# Patient Record
Sex: Female | Born: 1965 | Race: White | Hispanic: No | Marital: Married | Smoking: Current every day smoker
Health system: Southern US, Community
[De-identification: ages and names within clinical notes are randomized; demographics above are authoritative.]

## PROBLEM LIST (undated history)

## (undated) DIAGNOSIS — M722 Plantar fascial fibromatosis: Secondary | ICD-10-CM

## (undated) DIAGNOSIS — F419 Anxiety disorder, unspecified: Secondary | ICD-10-CM

## (undated) DIAGNOSIS — I10 Essential (primary) hypertension: Secondary | ICD-10-CM

## (undated) DIAGNOSIS — M797 Fibromyalgia: Secondary | ICD-10-CM

## (undated) DIAGNOSIS — E7800 Pure hypercholesterolemia, unspecified: Secondary | ICD-10-CM

## (undated) DIAGNOSIS — M1990 Unspecified osteoarthritis, unspecified site: Secondary | ICD-10-CM

## (undated) DIAGNOSIS — Z87442 Personal history of urinary calculi: Secondary | ICD-10-CM

## (undated) DIAGNOSIS — K589 Irritable bowel syndrome without diarrhea: Secondary | ICD-10-CM

## (undated) DIAGNOSIS — E119 Type 2 diabetes mellitus without complications: Secondary | ICD-10-CM

## (undated) DIAGNOSIS — G43909 Migraine, unspecified, not intractable, without status migrainosus: Secondary | ICD-10-CM

## (undated) HISTORY — DX: Plantar fascial fibromatosis: M72.2

## (undated) HISTORY — DX: Pure hypercholesterolemia, unspecified: E78.00

## (undated) HISTORY — DX: Unspecified osteoarthritis, unspecified site: M19.90

## (undated) HISTORY — PX: VAGINAL HYSTERECTOMY: SUR661

## (undated) HISTORY — DX: Irritable bowel syndrome, unspecified: K58.9

## (undated) HISTORY — DX: Fibromyalgia: M79.7

## (undated) HISTORY — PX: WRIST SURGERY: SHX841

## (undated) HISTORY — PX: ENDOSCOPIC PLANTAR FASCIOTOMY: SUR443

## (undated) HISTORY — DX: Migraine, unspecified, not intractable, without status migrainosus: G43.909

## (undated) HISTORY — DX: Essential (primary) hypertension: I10

---

## 2007-12-17 HISTORY — PX: COLONOSCOPY: SHX174

## 2011-09-03 DIAGNOSIS — E119 Type 2 diabetes mellitus without complications: Secondary | ICD-10-CM

## 2011-09-03 HISTORY — DX: Type 2 diabetes mellitus without complications: E11.9

## 2015-04-19 ENCOUNTER — Institutional Professional Consult (permissible substitution): Payer: Self-pay | Admitting: Internal Medicine

## 2015-04-27 ENCOUNTER — Ambulatory Visit (INDEPENDENT_AMBULATORY_CARE_PROVIDER_SITE_OTHER)
Admission: RE | Admit: 2015-04-27 | Discharge: 2015-04-27 | Disposition: A | Discharge status: Home / Self Care | Payer: 59 | Source: Ambulatory Visit | Attending: Pulmonary Disease | Admitting: Pulmonary Disease

## 2015-04-27 ENCOUNTER — Encounter: Payer: Self-pay | Admitting: Pulmonary Disease

## 2015-04-27 ENCOUNTER — Ambulatory Visit (INDEPENDENT_AMBULATORY_CARE_PROVIDER_SITE_OTHER): Payer: 59 | Admitting: Pulmonary Disease

## 2015-04-27 VITALS — BP 136/84 | HR 79 | Ht 60.75 in | Wt 159.0 lb

## 2015-04-27 DIAGNOSIS — R0600 Dyspnea, unspecified: Secondary | ICD-10-CM

## 2015-04-27 DIAGNOSIS — R5383 Other fatigue: Secondary | ICD-10-CM | POA: Diagnosis not present

## 2015-04-27 NOTE — Assessment & Plan Note (Addendum)
She is complaining of dyspnea which has been persistent for the last year. It is causing moderate to severe symptoms with exertion. Today on physical exam her lungs were normal and her resting oxygenation was normal. Objectively, her most recent chest x-ray was in 2014 and did not show lung disease, her simple spirometry test from her primary care physician's office was normal as well.  So in summary I currently have no evidence of lung disease but the workup is not complete.  I explained to her that the differential diagnosis of dyspnea is broad and includes lung disease, heart disease, anemia, and neurologic disease among other causes. I'm encouraged by the fact that her cardiovascular exam is normal, and she does not have chest pain. It does not sound as if she has been bleeding but I'm not certain that she's had a hemoglobin level checked recently. Today her neurologic exam was within normal limits.  Plan: We will take a stepwise approach to assessing her dyspnea Full pulmonary function testing, depending on results she may or may not need a CT scan of the chest Obtain records of most recent hemoglobin from primary care physician's office If pulmonary function testing and CT chest are normal, then we will set up a cardiac evaluation Ambulatory oximetry today Follow-up 2 weeks or sooner if needed

## 2015-04-27 NOTE — Progress Notes (Signed)
Subjective:    Patient ID: Valerie Horn, female    DOB: July 05, 1966, 49 y.o.   MRN: [id number]  HPI Chief Complaint  Patient presents with  . Advice Only    referred by Dr. Sedalia Muta for Sob.  pt feels like she isn't able to easily take in a deep breath.      This is a pleasant lady who is 49 years old and formally smoked one pack of cigarettes daily for 20 years he comes to my clinic today for evaluation of shortness of breath. She tells me that the problem has been persistent for the last year. She says she has the sensation that she can't get enough air into her lungs and feels that she has to take a deep breath 70-80 times a day. She says that she has noticed that she will get short of breath with exertion such as climbing a flight of stairs at work. She denies associated chest pain or wheezing. She has not had leg swelling recently. Apparently she previously had some ankle swelling which improved with changes in her blood pressure medication. She says that she has never been told she has a cardiac disease. She had a normal childhood with only some mild ear infections but no respiratory illnesses. Apparently as an early adult (her 63s) she had recurrent episodes of bronchitis but never required hospitalization.  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 She denies any neuromuscular weakness lately.  Her husband notes that she snores, and he has witnessed her stop breathing in the middle the night. She says that she has a lot of fatigue during the daytime, feels the need to take a nap in the afternoon.   Past Medical History  Diagnosis Date  . Migraine   . Fibromyalgia   . Hypercholesteremia   . Hypertension   . Arthritis   . Spastic colon   . IBS (irritable bowel syndrome)      Family History  Problem  Relation Age of Onset  . COPD Mother   . Congestive Heart Failure Mother   . Cancer Maternal Grandmother      Social History   Social History  . Marital Status: Married    Spouse Name: N/A  . Number of Children: N/A  . Years of Education: N/A   Occupational History  . Not on file.   Social History Main Topics  . Smoking status: Former Smoker -- 1.00 packs/day for 24 years    Types: Cigarettes    Quit date: 04/26/2010  . Smokeless tobacco: Never Used  . Alcohol Use: Not on file  . Drug Use: Not on file  . Sexual Activity: Not on file   Other Topics Concern  . Not on file   Social History Narrative  . No narrative on file     Allergies  Allergen Reactions  . Celebrex [Celecoxib] Hives  . Morphine And Related      No outpatient prescriptions prior to visit.   No facility-administered medications prior to visit.       Review of Systems  Constitutional: Positive for fatigue. Negative for fever and unexpected weight change.  HENT: Negative for congestion, dental problem, ear pain, nosebleeds, postnasal drip, rhinorrhea, sinus pressure, sneezing, sore throat and trouble swallowing.   Eyes: Negative for redness and itching.  Respiratory: Positive for shortness of breath. Negative for cough, chest tightness and wheezing.   Cardiovascular: Negative for palpitations and leg swelling.  Gastrointestinal: Negative for nausea and vomiting.  Genitourinary: Negative for dysuria.  Musculoskeletal: Negative for joint swelling.  Skin: Negative for rash.  Neurological: Negative for headaches.  Hematological: Does not bruise/bleed easily.  Psychiatric/Behavioral: Negative for dysphoric mood. The patient is not nervous/anxious.        Objective:   Physical Exam Filed Vitals:   04/27/15 1523  BP: 136/84  Pulse: 79  Height: 5' 0.75" (1.543 m)  Weight: 159 lb (72.122 kg)  SpO2: 97%   RA  Ambulated 500 feet in the office and her O2 saturation was no lower than  98%  Gen: well appearing, no acute distress HENT: NCAT, OP clear, neck supple without masses Eyes: PERRL, EOMi Lymph: no cervical lymphadenopathy PULM: CTA B CV: RRR, no mgr, no JVD GI: BS+, soft, nontender, no hsm Derm: no rash or skin breakdown MSK: normal bulk and tone Neuro: A&Ox4, CN II-XII intact, strength 5/5 in all 4 extremities Psyche: normal mood and affect  2014 chest x-ray images personally reviewed showing normal lung fields and a normal cardiac silhouette Most recent primary care physician's office notes were reviewed where she was treated for fibromyalgia and hypertension and referred to [place] for dyspnea.      Assessment & Plan:  Dyspnea She is complaining of dyspnea which has been persistent for the last year. It is causing moderate to severe symptoms with exertion. Today on physical exam her lungs were normal and her resting oxygenation was normal. Objectively, her most recent chest x-ray was in 2014 and did not show lung disease, her simple spirometry test from her primary care physician's office was normal as well.  So in summary I currently have no evidence of lung disease but the workup is not complete.  I explained to her that the differential diagnosis of dyspnea is broad and includes lung disease, heart disease, anemia, and neurologic disease among other causes. I'm encouraged by the fact that her cardiovascular exam is normal, and she does not have chest pain. It does not sound as if she has been bleeding but I'm not certain that she's had a hemoglobin level checked recently. Today her neurologic exam was within normal limits.  Plan: We will take a stepwise approach to assessing her dyspnea Full pulmonary function testing, depending on results she may or may not need a CT scan of the chest Obtain records of most recent hemoglobin from primary care physician's office If pulmonary function testing and CT chest are normal, then we will set up a cardiac  evaluation Ambulatory oximetry today Follow-up 2 weeks or sooner if needed  Fatigue She has daytime somnolence and fatigue with a history of snoring and witnessed apneas. This likely represents obstructive sleep apnea. She would prefer to have her lung function testing first before assessing her for obstructive sleep apnea.  Plan: No driving while sleepy If lung workup is negative then we'll pursue a polysomnogram.     Current outpatient prescriptions:  .  atenolol (TENORMIN) 50 MG tablet, Take 50 mg by mouth daily., Disp: , Rfl:  .  atorvastatin (LIPITOR) 20 MG tablet, Take 20 mg by mouth daily., Disp: , Rfl:  .  busPIRone (BUSPAR) 15 MG tablet, Take 15 mg by mouth 2 (two) times daily., Disp: , Rfl:  .  dicyclomine (BENTYL) 20 MG tablet, Take 20 mg by mouth 3 (three) times daily before  meals., Disp: , Rfl:  .  DULoxetine (CYMBALTA) 30 MG capsule, Take 120 mg by mouth daily., Disp: , Rfl:  .  gabapentin (NEURONTIN) 300 MG capsule, Take 600 mg by mouth 3 (three) times daily., Disp: , Rfl:  .  meloxicam (MOBIC) 15 MG tablet, Take 15 mg by mouth daily., Disp: , Rfl:  .  SUMAtriptan (IMITREX) 100 MG tablet, Take 100 mg by mouth once. May repeat in 2 hours if headache persists or recurs., Disp: , Rfl:  .  traMADol (ULTRAM) 50 MG tablet, Take 50 mg by mouth 2 (two) times daily., Disp: , Rfl:

## 2015-04-27 NOTE — Patient Instructions (Signed)
We will order a lung function test, and a chest x-ray and call you with the results We will see you back in 2 weeks to go over the results

## 2015-04-27 NOTE — Assessment & Plan Note (Signed)
She has daytime somnolence and fatigue with a history of snoring and witnessed apneas. This likely represents obstructive sleep apnea. She would prefer to have her lung function testing first before assessing her for obstructive sleep apnea.  Plan: No driving while sleepy If lung workup is negative then we'll pursue a polysomnogram.

## 2015-04-28 ENCOUNTER — Telehealth: Payer: Self-pay | Admitting: Pulmonary Disease

## 2015-04-28 ENCOUNTER — Other Ambulatory Visit (HOSPITAL_COMMUNITY): Payer: Self-pay | Admitting: Respiratory Therapy

## 2015-04-28 NOTE — Telephone Encounter (Signed)
Per Cxr results:  Result Note     A,    Please let the patient know this was OK    Thanks,    B   ---  [id number] [id number]

## 2015-05-01 NOTE — Telephone Encounter (Signed)
Patient notified.  No questions or concerns at this time. Nothing further needed.   

## 2015-05-05 ENCOUNTER — Ambulatory Visit (HOSPITAL_COMMUNITY)
Admission: RE | Admit: 2015-05-05 | Discharge: 2015-05-05 | Disposition: A | Discharge status: Home / Self Care | Payer: 59 | Source: Ambulatory Visit | Attending: Pulmonary Disease | Admitting: Pulmonary Disease

## 2015-05-05 DIAGNOSIS — R0600 Dyspnea, unspecified: Secondary | ICD-10-CM | POA: Insufficient documentation

## 2015-05-05 LAB — PULMONARY FUNCTION TEST
DL/VA % pred: 102 %
DL/VA: 4.45 ml/min/mmHg/L
DLCO UNC % PRED: 83 %
DLCO UNC: 16.66 ml/min/mmHg
FEF 25-75 PRE: 2.4 L/s
FEF 25-75 Post: 3.66 L/sec
FEF2575-%CHANGE-POST: 52 %
FEF2575-%Pred-Post: 138 %
FEF2575-%Pred-Pre: 90 %
FEV1-%Change-Post: 8 %
FEV1-%PRED-POST: 91 %
FEV1-%Pred-Pre: 84 %
FEV1-POST: 2.34 L
FEV1-Pre: 2.15 L
FEV1FVC-%Change-Post: 6 %
FEV1FVC-%Pred-Pre: 103 %
FEV6-%CHANGE-POST: 2 %
FEV6-%PRED-POST: 84 %
FEV6-%PRED-PRE: 82 %
FEV6-PRE: 2.56 L
FEV6-Post: 2.61 L
FEV6FVC-%CHANGE-POST: 0 %
FEV6FVC-%PRED-PRE: 103 %
FEV6FVC-%Pred-Post: 103 %
FVC-%CHANGE-POST: 2 %
FVC-%Pred-Post: 81 %
FVC-%Pred-Pre: 80 %
FVC-Post: 2.61 L
FVC-Pre: 2.56 L
POST FEV6/FVC RATIO: 100 %
Post FEV1/FVC ratio: 89 %
Pre FEV1/FVC ratio: 84 %
Pre FEV6/FVC Ratio: 100 %
RV % PRED: 101 %
RV: 1.62 L
TLC % pred: 91 %
TLC: 4.18 L

## 2015-05-05 MED ORDER — ALBUTEROL SULFATE (2.5 MG/3ML) 0.083% IN NEBU
2.5000 mg | INHALATION_SOLUTION | Freq: Once | RESPIRATORY_TRACT | Status: AC
  Administered 2015-05-05: 2.5 mg via RESPIRATORY_TRACT

## 2015-05-10 ENCOUNTER — Ambulatory Visit (INDEPENDENT_AMBULATORY_CARE_PROVIDER_SITE_OTHER): Payer: 59 | Admitting: Pulmonary Disease

## 2015-05-10 VITALS — BP 128/64 | HR 70 | Ht 60.75 in | Wt 158.0 lb

## 2015-05-10 DIAGNOSIS — R5383 Other fatigue: Secondary | ICD-10-CM | POA: Diagnosis not present

## 2015-05-10 DIAGNOSIS — R0600 Dyspnea, unspecified: Secondary | ICD-10-CM | POA: Diagnosis not present

## 2015-05-10 NOTE — Progress Notes (Signed)
Subjective:    Patient ID: Valerie Horn, female    DOB: [birth year], 49 y.o.   MRN: [id number]  Synopsis: First seen by [facility] in 2016 for dyspnea. Smoked heavily 3 2016. 03/2015 H/H > 12.5 05/2015 Ratio 89% FEV 2.34L (91%) TLC 4.18 L (91%) DLCO 16.66 (83%)  HPI Chief Complaint  Patient presents with  . Follow-up    review PFT and cxr.  pt c/o stable DOE, wheezing.   Kenetra says that she continues to have some shortness of breath when she exerts her self. Minimal cough. She has noticed an occasional wheeze which she says is associated with a rattling in the left lower chest. She still has fatigue throughout the course the day. Her husband notes ongoing snoring and witnessed apneas.  Past Medical History  Diagnosis Date  . Migraine   . Fibromyalgia   . Hypercholesteremia   . Hypertension   . Arthritis   . Spastic colon   . IBS (irritable bowel syndrome)       Review of Systems  Constitutional: Positive for fatigue. Negative for fever and chills.  HENT: Negative for postnasal drip, rhinorrhea and sinus pressure.   Respiratory: Positive for cough, shortness of breath and wheezing.   Cardiovascular: Negative for chest pain, palpitations and leg swelling.       Objective:   Physical Exam Filed Vitals:   05/10/15 1536  BP: 128/64  Pulse: 70  Height: 5' 0.75" (1.543 m)  Weight: 158 lb (71.668 kg)  SpO2: 98%   RA  Gen: well appearing HENT: OP clear, TM's clear, neck supple PULM: CTA B, normal percussion CV: RRR, no mgr, trace edema GI: BS+, soft, nontender Derm: no cyanosis or rash Psyche: normal mood and affect  PFT and CXR both normal, CXR images personally reviewed     Assessment & Plan:  Dyspnea She continues to have dyspnea. Objectively she is not anemic, her lung function testing was completely normal, her chest x-ray is normal, her lung exam is normal, and her ambulatory oximetry is normal. So I cannot find evidence of lung disease at this  time. Because of her lengthy smoking history she is at increased risk for cardiac disease.  Plan: Exercise stress test with nuclear imaging  Fatigue This continues to be a problem. She is overweight, has witnessed apneas and heavy snoring and feels fatigue throughout the course the day. She is at high-risk for having obstructive sleep apnea.  Plan: Polysomnogram     Current outpatient prescriptions:  .  atenolol (TENORMIN) 50 MG tablet, Take 50 mg by mouth daily., Disp: , Rfl:  .  atorvastatin (LIPITOR) 20 MG tablet, Take 20 mg by mouth daily., Disp: , Rfl:  .  busPIRone (BUSPAR) 15 MG tablet, Take 15 mg by mouth 2 (two) times daily., Disp: , Rfl:  .  dicyclomine (BENTYL) 20 MG tablet, Take 20 mg by mouth 3 (three) times daily before meals., Disp: , Rfl:  .  DULoxetine (CYMBALTA) 30 MG capsule, Take 120 mg by mouth daily., Disp: , Rfl:  .  gabapentin (NEURONTIN) 300 MG capsule, Take 600 mg by mouth 3 (three) times daily., Disp: , Rfl:  .  meloxicam (MOBIC) 15 MG tablet, Take 15 mg by mouth daily., Disp: , Rfl:  .  SUMAtriptan (IMITREX) 100 MG tablet, Take 100 mg by mouth once. May repeat in 2 hours if headache persists or recurs., Disp: , Rfl:  .  traMADol (ULTRAM) 50 MG tablet, Take 50 mg by mouth 2 (  two) times daily., Disp: , Rfl:

## 2015-05-10 NOTE — Assessment & Plan Note (Signed)
This continues to be a problem. She is overweight, has witnessed apneas and heavy snoring and feels fatigue throughout the course the day. She is at high-risk for having obstructive sleep apnea.  Plan: Polysomnogram

## 2015-05-10 NOTE — Assessment & Plan Note (Signed)
She continues to have dyspnea. Objectively she is not anemic, her lung function testing was completely normal, her chest x-ray is normal, her lung exam is normal, and her ambulatory oximetry is normal. So I cannot find evidence of lung disease at this time. Because of her lengthy smoking history she is at increased risk for cardiac disease.  Plan: Exercise stress test with nuclear imaging

## 2015-05-10 NOTE — Patient Instructions (Signed)
We will arrange an exercise stress testing, you with results We will arrange a sleep study and call you with the results We will see you back in 4 weeks or sooner if needed

## 2015-05-11 ENCOUNTER — Encounter: Payer: Self-pay | Admitting: Pulmonary Disease

## 2015-05-17 ENCOUNTER — Telehealth (HOSPITAL_COMMUNITY): Payer: Self-pay | Admitting: *Deleted

## 2015-05-17 NOTE — Telephone Encounter (Signed)
Patient given detailed instructions per Myocardial Perfusion Study Information Sheet for test on 05/19/15 at 915. Patient notified to arrive 15 minutes early and that it is imperative to arrive on time for appointment to keep from having the test rescheduled.  If you need to cancel or reschedule your appointment, please call the office within 24 hours of your appointment. Failure to do so may result in a cancellation of your appointment, and a $50 no show fee. Patient verbalized understanding. Antionette Char, RN

## 2015-05-18 ENCOUNTER — Telehealth: Payer: Self-pay

## 2015-05-19 ENCOUNTER — Encounter (HOSPITAL_COMMUNITY): Payer: 59

## 2015-05-24 ENCOUNTER — Telehealth: Payer: Self-pay

## 2015-05-24 NOTE — Telephone Encounter (Signed)
RECEIVED PHONE CALL FROM PATIENT SHE IS WANTING TO RESC NUC TEST THAT DR .Kendrick Fries OFFICE [facility].  I  WILL FORWARD MESSAGE TO OUR [facility]

## 2015-05-29 ENCOUNTER — Telehealth (HOSPITAL_COMMUNITY): Payer: Self-pay | Admitting: *Deleted

## 2015-05-29 NOTE — Telephone Encounter (Signed)
Patient given detailed instructions per Myocardial Perfusion Study Information Sheet for test on 05/30/15 at 7:00. Patient notified to arrive 15 minutes early and that it is imperative to arrive on time for appointment to keep from having the test rescheduled.  If you need to cancel or reschedule your appointment, please call the office within 24 hours of your appointment. Failure to do so may result in a cancellation of your appointment, and a $50 no show fee. Patient verbalized understanding. Valerie Horn

## 2015-05-30 ENCOUNTER — Ambulatory Visit (HOSPITAL_COMMUNITY): Payer: 59 | Attending: Pulmonary Disease

## 2015-05-30 DIAGNOSIS — E785 Hyperlipidemia, unspecified: Secondary | ICD-10-CM | POA: Diagnosis not present

## 2015-05-30 DIAGNOSIS — R0602 Shortness of breath: Secondary | ICD-10-CM | POA: Diagnosis not present

## 2015-05-30 DIAGNOSIS — R0600 Dyspnea, unspecified: Secondary | ICD-10-CM | POA: Diagnosis not present

## 2015-05-30 DIAGNOSIS — E119 Type 2 diabetes mellitus without complications: Secondary | ICD-10-CM | POA: Diagnosis not present

## 2015-05-30 DIAGNOSIS — R002 Palpitations: Secondary | ICD-10-CM | POA: Insufficient documentation

## 2015-05-30 DIAGNOSIS — R5383 Other fatigue: Secondary | ICD-10-CM | POA: Insufficient documentation

## 2015-05-30 DIAGNOSIS — I10 Essential (primary) hypertension: Secondary | ICD-10-CM | POA: Insufficient documentation

## 2015-05-30 LAB — MYOCARDIAL PERFUSION IMAGING
CHL CUP NUCLEAR SDS: 1
CHL CUP NUCLEAR SRS: 1
CHL CUP RESTING HR STRESS: 86 {beats}/min
CSEPED: 4 min
CSEPEDS: 30 s
Estimated workload: 6.4 METS
LV dias vol: 80 mL
LV sys vol: 30 mL
MPHR: 172 {beats}/min
Peak HR: 151 {beats}/min
Percent HR: 87 %
RATE: 0.4
RPE: 18
SSS: 2
TID: 1.04

## 2015-05-30 MED ORDER — TECHNETIUM TC 99M SESTAMIBI GENERIC - CARDIOLITE
10.6000 | Freq: Once | INTRAVENOUS | Status: AC | PRN
  Administered 2015-05-30: 11 via INTRAVENOUS

## 2015-05-30 MED ORDER — TECHNETIUM TC 99M SESTAMIBI GENERIC - CARDIOLITE
31.8000 | Freq: Once | INTRAVENOUS | Status: AC | PRN
  Administered 2015-05-30: 31.8 via INTRAVENOUS

## 2015-06-21 ENCOUNTER — Ambulatory Visit (HOSPITAL_BASED_OUTPATIENT_CLINIC_OR_DEPARTMENT_OTHER): Payer: 59 | Attending: Pulmonary Disease | Admitting: Radiology

## 2015-06-21 DIAGNOSIS — R0683 Snoring: Secondary | ICD-10-CM | POA: Insufficient documentation

## 2015-06-21 DIAGNOSIS — G4730 Sleep apnea, unspecified: Secondary | ICD-10-CM | POA: Insufficient documentation

## 2015-06-21 DIAGNOSIS — R51 Headache: Secondary | ICD-10-CM | POA: Insufficient documentation

## 2015-06-21 DIAGNOSIS — R5383 Other fatigue: Secondary | ICD-10-CM | POA: Diagnosis not present

## 2015-06-27 DIAGNOSIS — R5383 Other fatigue: Secondary | ICD-10-CM | POA: Diagnosis not present

## 2015-06-27 NOTE — Progress Notes (Signed)
Patient Name: Valerie Horn, Valerie Horn Date: 06/21/2015 Gender: Female D.O.B: [birth year] Age (years): 49 Referring Provider: Max Fickleouglas McQuaid Height (inches): 60 Interpreting Physician: Cyril Mourningakesh Alva MD, ABSM Weight (lbs): 158 RPSGT: Valerie Horn, [place] BMI: 31 MRN: [id number] Neck Size: 14.00   CLINICAL INFORMATION Sleep Horn Type: NPSG Indication for sleep Horn: Daytime Fatigue, Fatigue, Morning Headaches, Nocturnal Gasping, Restless Sleep with Limb Movments, Snoring, Witnesses Apnea / Gasping During Sleep Epworth Sleepiness Score: 11   SLEEP Horn TECHNIQUE As per the AASM Manual for the Scoring of Sleep and Associated Events v2.3 (April 2016) with a hypopnea requiring 4% desaturations. The channels recorded and monitored were frontal, central and occipital EEG, electrooculogram (EOG), submentalis EMG (chin), nasal and oral airflow, thoracic and abdominal wall motion, anterior tibialis EMG, snore microphone, electrocardiogram, and pulse oximetry.   MEDICATIONS Patient's medications include: PATIENT REPORTED BUSPAR, BENTYL AND GABAPENTIN WERE TAKEN AT 1950. Medications self-administered by patient during sleep Horn : No sleep medicine administered.   SLEEP ARCHITECTURE The Horn was initiated at 10:04:56 PM and ended at 4:38:21 AM. Sleep onset time was 7.8 minutes and the sleep efficiency was 83.5%. The total sleep time was 328.5 minutes. Stage REM latency was 233.0 minutes. The patient spent 13.09% of the night in stage N1 sleep, 73.36% in stage N2 sleep, 0.00% in stage N3 and 13.55% in REM. Alpha intrusion was absent. Supine sleep was 15.53%.   RESPIRATORY PARAMETERS The overall apnea/hypopnea index (AHI) was 2.9 per hour. There were 3 total apneas, including 2 obstructive, 0 central and 1 mixed apneas. There were 13 hypopneas and 8 RERAs. The AHI during Stage REM sleep was 14.8 per hour. AHI while supine was 1.2 per hour. The mean oxygen saturation was 92.69%. The minimum  SpO2 during sleep was 86.00%. Soft snoring was noted during this Horn.  CARDIAC DATA The 2 lead EKG demonstrated sinus rhythm. The mean heart rate was 73.69 beats per minute. Other EKG findings include: None.  LEG MOVEMENT DATA The total PLMS were 8 with a resulting PLMS index of 1.46. Associated arousal with leg movement index was 0.7 .   IMPRESSIONS - No significant obstructive sleep apnea occurred during this Horn (AHI = 2.9/h). - No significant central sleep apnea occurred during this Horn (CAI = 0.0/h). - Mild REM related oxygen desaturation was noted during this Horn (Min O2 = 86.00%). - The patient snored with Soft snoring volume. - No cardiac abnormalities were noted during this Horn. - Clinically significant periodic limb movements did not occur during sleep. No significant associated arousals.   DIAGNOSIS - No evidence of sleep disordered breathing - Mild REM-related Desaturation does not require treatment   RECOMMENDATIONS - Avoid alcohol, sedatives and other CNS depressants that may worsen sleep apnea and disrupt normal sleep architecture. - Sleep hygiene should be reviewed to assess factors that may improve sleep quality. - Weight management and regular exercise should be initiated or continued if appropriate.  Cyril Mourningakesh Alva MD. Tonny BollmanFCCP. [facility]

## 2015-07-06 ENCOUNTER — Ambulatory Visit: Payer: 59 | Admitting: Pulmonary Disease

## 2016-03-14 DIAGNOSIS — F320 Major depressive disorder, single episode, mild: Secondary | ICD-10-CM | POA: Diagnosis not present

## 2016-03-14 DIAGNOSIS — I10 Essential (primary) hypertension: Secondary | ICD-10-CM | POA: Diagnosis not present

## 2016-03-14 DIAGNOSIS — R7301 Impaired fasting glucose: Secondary | ICD-10-CM | POA: Diagnosis not present

## 2016-03-14 DIAGNOSIS — M150 Primary generalized (osteo)arthritis: Secondary | ICD-10-CM | POA: Diagnosis not present

## 2016-03-14 DIAGNOSIS — E782 Mixed hyperlipidemia: Secondary | ICD-10-CM | POA: Diagnosis not present

## 2016-03-14 DIAGNOSIS — G43109 Migraine with aura, not intractable, without status migrainosus: Secondary | ICD-10-CM | POA: Diagnosis not present

## 2016-03-14 DIAGNOSIS — R799 Abnormal finding of blood chemistry, unspecified: Secondary | ICD-10-CM | POA: Diagnosis not present

## 2016-03-14 DIAGNOSIS — R5383 Other fatigue: Secondary | ICD-10-CM | POA: Diagnosis not present

## 2016-03-14 DIAGNOSIS — E784 Other hyperlipidemia: Secondary | ICD-10-CM | POA: Diagnosis not present

## 2016-05-18 DIAGNOSIS — N201 Calculus of ureter: Secondary | ICD-10-CM | POA: Diagnosis not present

## 2016-05-18 DIAGNOSIS — N200 Calculus of kidney: Secondary | ICD-10-CM | POA: Diagnosis not present

## 2016-06-19 DIAGNOSIS — M150 Primary generalized (osteo)arthritis: Secondary | ICD-10-CM | POA: Diagnosis not present

## 2016-06-19 DIAGNOSIS — E782 Mixed hyperlipidemia: Secondary | ICD-10-CM | POA: Diagnosis not present

## 2016-06-19 DIAGNOSIS — F320 Major depressive disorder, single episode, mild: Secondary | ICD-10-CM | POA: Diagnosis not present

## 2016-06-19 DIAGNOSIS — G43109 Migraine with aura, not intractable, without status migrainosus: Secondary | ICD-10-CM | POA: Diagnosis not present

## 2016-06-19 DIAGNOSIS — E784 Other hyperlipidemia: Secondary | ICD-10-CM | POA: Diagnosis not present

## 2016-06-19 DIAGNOSIS — E119 Type 2 diabetes mellitus without complications: Secondary | ICD-10-CM | POA: Diagnosis not present

## 2016-09-02 HISTORY — PX: IR RETRO URETHROCYSTOGRAM: IMG2375

## 2016-10-29 DIAGNOSIS — M25562 Pain in left knee: Secondary | ICD-10-CM | POA: Diagnosis not present

## 2016-10-29 DIAGNOSIS — M25569 Pain in unspecified knee: Secondary | ICD-10-CM | POA: Diagnosis not present

## 2016-10-29 DIAGNOSIS — K580 Irritable bowel syndrome with diarrhea: Secondary | ICD-10-CM | POA: Diagnosis not present

## 2016-12-19 DIAGNOSIS — B029 Zoster without complications: Secondary | ICD-10-CM | POA: Diagnosis not present

## 2017-01-16 DIAGNOSIS — F320 Major depressive disorder, single episode, mild: Secondary | ICD-10-CM | POA: Diagnosis not present

## 2017-01-16 DIAGNOSIS — M797 Fibromyalgia: Secondary | ICD-10-CM | POA: Diagnosis not present

## 2017-01-29 DIAGNOSIS — R740 Nonspecific elevation of levels of transaminase and lactic acid dehydrogenase [LDH]: Secondary | ICD-10-CM | POA: Diagnosis not present

## 2017-01-29 DIAGNOSIS — Z0000 Encounter for general adult medical examination without abnormal findings: Secondary | ICD-10-CM | POA: Diagnosis not present

## 2017-01-29 DIAGNOSIS — Z6830 Body mass index (BMI) 30.0-30.9, adult: Secondary | ICD-10-CM | POA: Diagnosis not present

## 2017-01-29 DIAGNOSIS — E119 Type 2 diabetes mellitus without complications: Secondary | ICD-10-CM | POA: Diagnosis not present

## 2017-02-07 DIAGNOSIS — R740 Nonspecific elevation of levels of transaminase and lactic acid dehydrogenase [LDH]: Secondary | ICD-10-CM | POA: Diagnosis not present

## 2017-02-07 DIAGNOSIS — R945 Abnormal results of liver function studies: Secondary | ICD-10-CM | POA: Diagnosis not present

## 2017-02-26 DIAGNOSIS — R799 Abnormal finding of blood chemistry, unspecified: Secondary | ICD-10-CM | POA: Diagnosis not present

## 2017-02-26 DIAGNOSIS — R740 Nonspecific elevation of levels of transaminase and lactic acid dehydrogenase [LDH]: Secondary | ICD-10-CM | POA: Diagnosis not present

## 2017-03-27 DIAGNOSIS — M797 Fibromyalgia: Secondary | ICD-10-CM | POA: Diagnosis not present

## 2017-03-27 DIAGNOSIS — M25552 Pain in left hip: Secondary | ICD-10-CM | POA: Diagnosis not present

## 2017-05-22 DIAGNOSIS — N202 Calculus of kidney with calculus of ureter: Secondary | ICD-10-CM | POA: Diagnosis not present

## 2017-05-22 DIAGNOSIS — R109 Unspecified abdominal pain: Secondary | ICD-10-CM | POA: Diagnosis not present

## 2017-05-22 DIAGNOSIS — R1032 Left lower quadrant pain: Secondary | ICD-10-CM | POA: Diagnosis not present

## 2017-05-22 DIAGNOSIS — N201 Calculus of ureter: Secondary | ICD-10-CM | POA: Diagnosis not present

## 2017-05-25 DIAGNOSIS — N202 Calculus of kidney with calculus of ureter: Secondary | ICD-10-CM | POA: Diagnosis not present

## 2017-05-25 DIAGNOSIS — N132 Hydronephrosis with renal and ureteral calculous obstruction: Secondary | ICD-10-CM | POA: Diagnosis not present

## 2017-05-25 DIAGNOSIS — M549 Dorsalgia, unspecified: Secondary | ICD-10-CM | POA: Diagnosis not present

## 2017-05-25 DIAGNOSIS — I10 Essential (primary) hypertension: Secondary | ICD-10-CM | POA: Diagnosis not present

## 2017-05-25 DIAGNOSIS — N289 Disorder of kidney and ureter, unspecified: Secondary | ICD-10-CM | POA: Diagnosis not present

## 2017-05-25 DIAGNOSIS — Z79899 Other long term (current) drug therapy: Secondary | ICD-10-CM | POA: Diagnosis not present

## 2017-05-25 DIAGNOSIS — N201 Calculus of ureter: Secondary | ICD-10-CM | POA: Diagnosis not present

## 2017-05-25 DIAGNOSIS — R109 Unspecified abdominal pain: Secondary | ICD-10-CM | POA: Diagnosis not present

## 2017-05-26 DIAGNOSIS — M549 Dorsalgia, unspecified: Secondary | ICD-10-CM | POA: Diagnosis not present

## 2017-05-26 DIAGNOSIS — N289 Disorder of kidney and ureter, unspecified: Secondary | ICD-10-CM | POA: Diagnosis not present

## 2017-05-26 DIAGNOSIS — N132 Hydronephrosis with renal and ureteral calculous obstruction: Secondary | ICD-10-CM | POA: Diagnosis not present

## 2017-05-26 DIAGNOSIS — E119 Type 2 diabetes mellitus without complications: Secondary | ICD-10-CM | POA: Diagnosis not present

## 2017-05-26 DIAGNOSIS — N201 Calculus of ureter: Secondary | ICD-10-CM | POA: Diagnosis not present

## 2017-05-26 DIAGNOSIS — I10 Essential (primary) hypertension: Secondary | ICD-10-CM | POA: Diagnosis not present

## 2017-05-27 ENCOUNTER — Encounter (HOSPITAL_COMMUNITY): Payer: Self-pay | Admitting: *Deleted

## 2017-05-27 DIAGNOSIS — R1032 Left lower quadrant pain: Secondary | ICD-10-CM | POA: Diagnosis not present

## 2017-05-27 DIAGNOSIS — N200 Calculus of kidney: Secondary | ICD-10-CM | POA: Diagnosis not present

## 2017-05-28 ENCOUNTER — Encounter (HOSPITAL_COMMUNITY): Payer: Self-pay | Admitting: Certified Registered Nurse Anesthetist

## 2017-05-28 DIAGNOSIS — M797 Fibromyalgia: Secondary | ICD-10-CM | POA: Diagnosis not present

## 2017-05-28 DIAGNOSIS — I10 Essential (primary) hypertension: Secondary | ICD-10-CM | POA: Diagnosis not present

## 2017-05-28 DIAGNOSIS — E782 Mixed hyperlipidemia: Secondary | ICD-10-CM | POA: Diagnosis not present

## 2017-05-28 DIAGNOSIS — F320 Major depressive disorder, single episode, mild: Secondary | ICD-10-CM | POA: Diagnosis not present

## 2017-05-28 DIAGNOSIS — E119 Type 2 diabetes mellitus without complications: Secondary | ICD-10-CM | POA: Diagnosis not present

## 2017-05-29 ENCOUNTER — Encounter (HOSPITAL_COMMUNITY)
Admission: RE | Disposition: A | Discharge status: Home / Self Care | Payer: Self-pay | Source: Ambulatory Visit | Attending: Urology

## 2017-05-29 ENCOUNTER — Encounter (HOSPITAL_COMMUNITY): Payer: Self-pay | Admitting: *Deleted

## 2017-05-29 ENCOUNTER — Ambulatory Visit (HOSPITAL_COMMUNITY): Payer: BLUE CROSS/BLUE SHIELD

## 2017-05-29 ENCOUNTER — Ambulatory Visit (HOSPITAL_COMMUNITY)
Admission: RE | Admit: 2017-05-29 | Discharge: 2017-05-29 | Disposition: A | Discharge status: Home / Self Care | Payer: BLUE CROSS/BLUE SHIELD | Source: Ambulatory Visit | Attending: Urology | Admitting: Urology

## 2017-05-29 DIAGNOSIS — Z87891 Personal history of nicotine dependence: Secondary | ICD-10-CM | POA: Diagnosis not present

## 2017-05-29 DIAGNOSIS — Z6829 Body mass index (BMI) 29.0-29.9, adult: Secondary | ICD-10-CM | POA: Insufficient documentation

## 2017-05-29 DIAGNOSIS — Z885 Allergy status to narcotic agent status: Secondary | ICD-10-CM | POA: Insufficient documentation

## 2017-05-29 DIAGNOSIS — I10 Essential (primary) hypertension: Secondary | ICD-10-CM | POA: Diagnosis not present

## 2017-05-29 DIAGNOSIS — Z7984 Long term (current) use of oral hypoglycemic drugs: Secondary | ICD-10-CM | POA: Insufficient documentation

## 2017-05-29 DIAGNOSIS — E119 Type 2 diabetes mellitus without complications: Secondary | ICD-10-CM | POA: Diagnosis not present

## 2017-05-29 DIAGNOSIS — Z791 Long term (current) use of non-steroidal anti-inflammatories (NSAID): Secondary | ICD-10-CM | POA: Insufficient documentation

## 2017-05-29 DIAGNOSIS — E669 Obesity, unspecified: Secondary | ICD-10-CM | POA: Diagnosis not present

## 2017-05-29 DIAGNOSIS — M1990 Unspecified osteoarthritis, unspecified site: Secondary | ICD-10-CM | POA: Insufficient documentation

## 2017-05-29 DIAGNOSIS — Z888 Allergy status to other drugs, medicaments and biological substances status: Secondary | ICD-10-CM | POA: Diagnosis not present

## 2017-05-29 DIAGNOSIS — N201 Calculus of ureter: Secondary | ICD-10-CM | POA: Insufficient documentation

## 2017-05-29 DIAGNOSIS — N200 Calculus of kidney: Secondary | ICD-10-CM | POA: Diagnosis not present

## 2017-05-29 DIAGNOSIS — Z79899 Other long term (current) drug therapy: Secondary | ICD-10-CM | POA: Diagnosis not present

## 2017-05-29 HISTORY — DX: Personal history of urinary calculi: Z87.442

## 2017-05-29 HISTORY — DX: Anxiety disorder, unspecified: F41.9

## 2017-05-29 HISTORY — PX: EXTRACORPOREAL SHOCK WAVE LITHOTRIPSY: SHX1557

## 2017-05-29 HISTORY — DX: Type 2 diabetes mellitus without complications: E11.9

## 2017-05-29 LAB — GLUCOSE, CAPILLARY: Glucose-Capillary: 102 mg/dL — ABNORMAL HIGH (ref 65–99)

## 2017-05-29 SURGERY — LITHOTRIPSY, ESWL
Anesthesia: General | Laterality: Left

## 2017-05-29 MED ORDER — HYDROCODONE-ACETAMINOPHEN 5-325 MG PO TABS: 1.0000 | ORAL_TABLET | ORAL | Status: DC | PRN

## 2017-05-29 MED ORDER — ONDANSETRON HCL 4 MG/2ML IJ SOLN: 4.0000 mg | INTRAMUSCULAR | Status: DC | PRN

## 2017-05-29 MED ORDER — LACTATED RINGERS IV SOLN
INTRAVENOUS | Status: DC
  Administered 2017-05-29: 15:00:00 via INTRAVENOUS

## 2017-05-29 MED ORDER — HYDROMORPHONE HCL-NACL 0.5-0.9 MG/ML-% IV SOSY: 1.0000 mg | PREFILLED_SYRINGE | INTRAVENOUS | Status: DC | PRN

## 2017-05-29 MED ORDER — DIAZEPAM 5 MG PO TABS
10.0000 mg | ORAL_TABLET | ORAL | Status: AC
  Administered 2017-05-29: 10 mg via ORAL
  Filled 2017-05-29: qty 2

## 2017-05-29 MED ORDER — DIPHENHYDRAMINE HCL 25 MG PO CAPS
25.0000 mg | ORAL_CAPSULE | ORAL | Status: AC
  Administered 2017-05-29: 25 mg via ORAL
  Filled 2017-05-29: qty 1

## 2017-05-29 MED ORDER — GENTAMICIN SULFATE 40 MG/ML IJ SOLN
350.0000 mg | Freq: Once | INTRAVENOUS | Status: AC
  Administered 2017-05-29: 350 mg via INTRAVENOUS
  Filled 2017-05-29: qty 8.75

## 2017-05-29 NOTE — Discharge Instructions (Signed)
See [facility] Discharge Instructions   Moderate Conscious Sedation, Adult, Care After These instructions provide you with information about caring for yourself after your procedure. Your health care provider may also give you more specific instructions. Your treatment has been planned according to current medical practices, but problems sometimes occur. Call your health care provider if you have any problems or questions after your procedure. What can I expect after the procedure? After your procedure, it is common:  To feel sleepy for several hours.  To feel clumsy and have poor balance for several hours.  To have poor judgment for several hours.  To vomit if you eat too soon.  Follow these instructions at home: For at least 24 hours after the procedure:   Do not: ? Participate in activities where you could fall or become injured. ? Drive. ? Use heavy machinery. ? Drink alcohol. ? Take sleeping pills or medicines that cause drowsiness. ? Make important decisions or sign legal documents. ? Take care of children on your own.  Rest. Eating and drinking  Follow the diet recommended by your health care provider.  If you vomit: ? Drink water, juice, or soup when you can drink without vomiting. ? Make sure you have little or no nausea before eating solid foods. General instructions  Have a responsible adult stay with you until you are awake and alert.  Take over-the-counter and prescription medicines only as told by your health care provider.  If you smoke, do not smoke without supervision.  Keep all follow-up visits as told by your health care provider. This is important. Contact a health care provider if:  You keep feeling nauseous or you keep vomiting.  You feel light-headed.  You develop a rash.  You have a fever. Get help right away if:  You have trouble breathing. This information is not intended to replace advice given to you by your health care  provider. Make sure you discuss any questions you have with your health care provider. Document Released: 06/09/2013 Document Revised: 01/22/2016 Document Reviewed: 12/09/2015 Elsevier Interactive Patient Education  [employer].

## 2017-06-02 ENCOUNTER — Encounter (HOSPITAL_COMMUNITY): Payer: Self-pay | Admitting: Urology

## 2017-06-06 DIAGNOSIS — N200 Calculus of kidney: Secondary | ICD-10-CM | POA: Diagnosis not present

## 2017-06-09 DIAGNOSIS — R1032 Left lower quadrant pain: Secondary | ICD-10-CM | POA: Diagnosis not present

## 2017-06-09 DIAGNOSIS — N200 Calculus of kidney: Secondary | ICD-10-CM | POA: Diagnosis not present

## 2017-06-18 DIAGNOSIS — N200 Calculus of kidney: Secondary | ICD-10-CM | POA: Diagnosis not present

## 2017-06-26 DIAGNOSIS — R1032 Left lower quadrant pain: Secondary | ICD-10-CM | POA: Diagnosis not present

## 2017-06-26 DIAGNOSIS — N200 Calculus of kidney: Secondary | ICD-10-CM | POA: Diagnosis not present

## 2017-07-04 DIAGNOSIS — I10 Essential (primary) hypertension: Secondary | ICD-10-CM | POA: Diagnosis not present

## 2017-07-04 DIAGNOSIS — N200 Calculus of kidney: Secondary | ICD-10-CM | POA: Diagnosis not present

## 2017-07-04 DIAGNOSIS — E119 Type 2 diabetes mellitus without complications: Secondary | ICD-10-CM | POA: Diagnosis not present

## 2017-07-11 DIAGNOSIS — R1032 Left lower quadrant pain: Secondary | ICD-10-CM | POA: Diagnosis not present

## 2017-07-11 DIAGNOSIS — N200 Calculus of kidney: Secondary | ICD-10-CM | POA: Diagnosis not present

## 2017-11-16 DIAGNOSIS — R1011 Right upper quadrant pain: Secondary | ICD-10-CM | POA: Diagnosis not present

## 2017-11-16 DIAGNOSIS — R109 Unspecified abdominal pain: Secondary | ICD-10-CM | POA: Diagnosis not present

## 2017-11-16 DIAGNOSIS — N200 Calculus of kidney: Secondary | ICD-10-CM | POA: Diagnosis not present

## 2017-12-31 DIAGNOSIS — R740 Nonspecific elevation of levels of transaminase and lactic acid dehydrogenase [LDH]: Secondary | ICD-10-CM | POA: Diagnosis not present

## 2017-12-31 DIAGNOSIS — E782 Mixed hyperlipidemia: Secondary | ICD-10-CM | POA: Diagnosis not present

## 2017-12-31 DIAGNOSIS — E1169 Type 2 diabetes mellitus with other specified complication: Secondary | ICD-10-CM | POA: Diagnosis not present

## 2017-12-31 DIAGNOSIS — I10 Essential (primary) hypertension: Secondary | ICD-10-CM | POA: Diagnosis not present

## 2017-12-31 DIAGNOSIS — M797 Fibromyalgia: Secondary | ICD-10-CM | POA: Diagnosis not present

## 2017-12-31 DIAGNOSIS — E119 Type 2 diabetes mellitus without complications: Secondary | ICD-10-CM | POA: Diagnosis not present

## 2017-12-31 DIAGNOSIS — R5383 Other fatigue: Secondary | ICD-10-CM | POA: Diagnosis not present

## 2017-12-31 DIAGNOSIS — F320 Major depressive disorder, single episode, mild: Secondary | ICD-10-CM | POA: Diagnosis not present

## 2018-01-06 DIAGNOSIS — R799 Abnormal finding of blood chemistry, unspecified: Secondary | ICD-10-CM | POA: Diagnosis not present

## 2018-01-15 DIAGNOSIS — R945 Abnormal results of liver function studies: Secondary | ICD-10-CM | POA: Diagnosis not present

## 2018-01-15 DIAGNOSIS — R935 Abnormal findings on diagnostic imaging of other abdominal regions, including retroperitoneum: Secondary | ICD-10-CM | POA: Diagnosis not present

## 2018-01-15 DIAGNOSIS — R740 Nonspecific elevation of levels of transaminase and lactic acid dehydrogenase [LDH]: Secondary | ICD-10-CM | POA: Diagnosis not present

## 2018-04-03 DIAGNOSIS — F331 Major depressive disorder, recurrent, moderate: Secondary | ICD-10-CM | POA: Diagnosis not present

## 2018-05-21 DIAGNOSIS — F331 Major depressive disorder, recurrent, moderate: Secondary | ICD-10-CM | POA: Diagnosis not present

## 2018-06-01 DIAGNOSIS — F331 Major depressive disorder, recurrent, moderate: Secondary | ICD-10-CM | POA: Diagnosis not present

## 2018-06-23 DIAGNOSIS — Z6830 Body mass index (BMI) 30.0-30.9, adult: Secondary | ICD-10-CM | POA: Diagnosis not present

## 2018-06-23 DIAGNOSIS — Z1231 Encounter for screening mammogram for malignant neoplasm of breast: Secondary | ICD-10-CM | POA: Diagnosis not present

## 2018-06-23 DIAGNOSIS — Z0000 Encounter for general adult medical examination without abnormal findings: Secondary | ICD-10-CM | POA: Diagnosis not present

## 2018-06-23 DIAGNOSIS — Z1211 Encounter for screening for malignant neoplasm of colon: Secondary | ICD-10-CM | POA: Diagnosis not present

## 2018-06-23 DIAGNOSIS — E1169 Type 2 diabetes mellitus with other specified complication: Secondary | ICD-10-CM | POA: Diagnosis not present

## 2018-07-16 DIAGNOSIS — R748 Abnormal levels of other serum enzymes: Secondary | ICD-10-CM | POA: Diagnosis not present

## 2018-07-16 DIAGNOSIS — Z1211 Encounter for screening for malignant neoplasm of colon: Secondary | ICD-10-CM | POA: Diagnosis not present

## 2018-07-21 DIAGNOSIS — Z1231 Encounter for screening mammogram for malignant neoplasm of breast: Secondary | ICD-10-CM | POA: Diagnosis not present

## 2018-09-09 DIAGNOSIS — Z1211 Encounter for screening for malignant neoplasm of colon: Secondary | ICD-10-CM | POA: Diagnosis not present

## 2018-09-09 HISTORY — PX: COLONOSCOPY: SHX5424

## 2018-09-09 LAB — HM COLONOSCOPY

## 2018-10-22 DIAGNOSIS — R748 Abnormal levels of other serum enzymes: Secondary | ICD-10-CM | POA: Diagnosis not present

## 2018-12-08 DIAGNOSIS — R197 Diarrhea, unspecified: Secondary | ICD-10-CM | POA: Diagnosis not present

## 2018-12-08 DIAGNOSIS — R748 Abnormal levels of other serum enzymes: Secondary | ICD-10-CM | POA: Diagnosis not present

## 2018-12-16 DIAGNOSIS — E119 Type 2 diabetes mellitus without complications: Secondary | ICD-10-CM | POA: Diagnosis not present

## 2018-12-16 DIAGNOSIS — E782 Mixed hyperlipidemia: Secondary | ICD-10-CM | POA: Diagnosis not present

## 2018-12-16 DIAGNOSIS — I10 Essential (primary) hypertension: Secondary | ICD-10-CM | POA: Diagnosis not present

## 2018-12-16 DIAGNOSIS — F320 Major depressive disorder, single episode, mild: Secondary | ICD-10-CM | POA: Diagnosis not present

## 2018-12-16 DIAGNOSIS — M797 Fibromyalgia: Secondary | ICD-10-CM | POA: Diagnosis not present

## 2018-12-21 DIAGNOSIS — E782 Mixed hyperlipidemia: Secondary | ICD-10-CM | POA: Diagnosis not present

## 2018-12-21 DIAGNOSIS — R748 Abnormal levels of other serum enzymes: Secondary | ICD-10-CM | POA: Diagnosis not present

## 2018-12-21 DIAGNOSIS — E1169 Type 2 diabetes mellitus with other specified complication: Secondary | ICD-10-CM | POA: Diagnosis not present

## 2018-12-21 DIAGNOSIS — I10 Essential (primary) hypertension: Secondary | ICD-10-CM | POA: Diagnosis not present

## 2019-01-04 DIAGNOSIS — R748 Abnormal levels of other serum enzymes: Secondary | ICD-10-CM | POA: Diagnosis not present

## 2019-01-08 DIAGNOSIS — R748 Abnormal levels of other serum enzymes: Secondary | ICD-10-CM | POA: Diagnosis not present

## 2019-01-14 DIAGNOSIS — K7460 Unspecified cirrhosis of liver: Secondary | ICD-10-CM | POA: Diagnosis not present

## 2019-01-14 DIAGNOSIS — K7581 Nonalcoholic steatohepatitis (NASH): Secondary | ICD-10-CM | POA: Diagnosis not present

## 2019-01-14 DIAGNOSIS — R945 Abnormal results of liver function studies: Secondary | ICD-10-CM | POA: Diagnosis not present

## 2019-01-14 DIAGNOSIS — R748 Abnormal levels of other serum enzymes: Secondary | ICD-10-CM | POA: Diagnosis not present

## 2019-01-27 DIAGNOSIS — R233 Spontaneous ecchymoses: Secondary | ICD-10-CM | POA: Diagnosis not present

## 2019-01-27 DIAGNOSIS — R5383 Other fatigue: Secondary | ICD-10-CM | POA: Diagnosis not present

## 2019-02-01 DIAGNOSIS — E1169 Type 2 diabetes mellitus with other specified complication: Secondary | ICD-10-CM | POA: Diagnosis not present

## 2019-02-11 DIAGNOSIS — R799 Abnormal finding of blood chemistry, unspecified: Secondary | ICD-10-CM | POA: Diagnosis not present

## 2019-02-11 DIAGNOSIS — R1013 Epigastric pain: Secondary | ICD-10-CM | POA: Diagnosis not present

## 2019-02-11 DIAGNOSIS — R1084 Generalized abdominal pain: Secondary | ICD-10-CM | POA: Diagnosis not present

## 2019-02-11 DIAGNOSIS — K130 Diseases of lips: Secondary | ICD-10-CM | POA: Diagnosis not present

## 2019-02-11 DIAGNOSIS — E119 Type 2 diabetes mellitus without complications: Secondary | ICD-10-CM | POA: Diagnosis not present

## 2019-02-15 DIAGNOSIS — K2961 Other gastritis with bleeding: Secondary | ICD-10-CM | POA: Diagnosis not present

## 2019-02-15 DIAGNOSIS — K7460 Unspecified cirrhosis of liver: Secondary | ICD-10-CM | POA: Diagnosis not present

## 2019-02-15 DIAGNOSIS — K2960 Other gastritis without bleeding: Secondary | ICD-10-CM | POA: Diagnosis not present

## 2019-02-15 DIAGNOSIS — K210 Gastro-esophageal reflux disease with esophagitis: Secondary | ICD-10-CM | POA: Diagnosis not present

## 2019-02-15 HISTORY — PX: ESOPHAGOGASTRODUODENOSCOPY: SHX1529

## 2019-02-19 DIAGNOSIS — K2960 Other gastritis without bleeding: Secondary | ICD-10-CM | POA: Diagnosis not present

## 2019-03-16 DIAGNOSIS — F331 Major depressive disorder, recurrent, moderate: Secondary | ICD-10-CM | POA: Diagnosis not present

## 2019-03-16 DIAGNOSIS — R3121 Asymptomatic microscopic hematuria: Secondary | ICD-10-CM | POA: Diagnosis not present

## 2019-03-16 DIAGNOSIS — N3001 Acute cystitis with hematuria: Secondary | ICD-10-CM | POA: Diagnosis not present

## 2019-03-16 DIAGNOSIS — I10 Essential (primary) hypertension: Secondary | ICD-10-CM | POA: Diagnosis not present

## 2019-04-03 IMAGING — CR DG ABDOMEN 1V
1 series · 1 of 1 positions shown · non-contrast
Comparison: None.

CLINICAL DATA: Nephrolithiasis

EXAM:
ABDOMEN - 1 VIEW

[t abdomen supine]
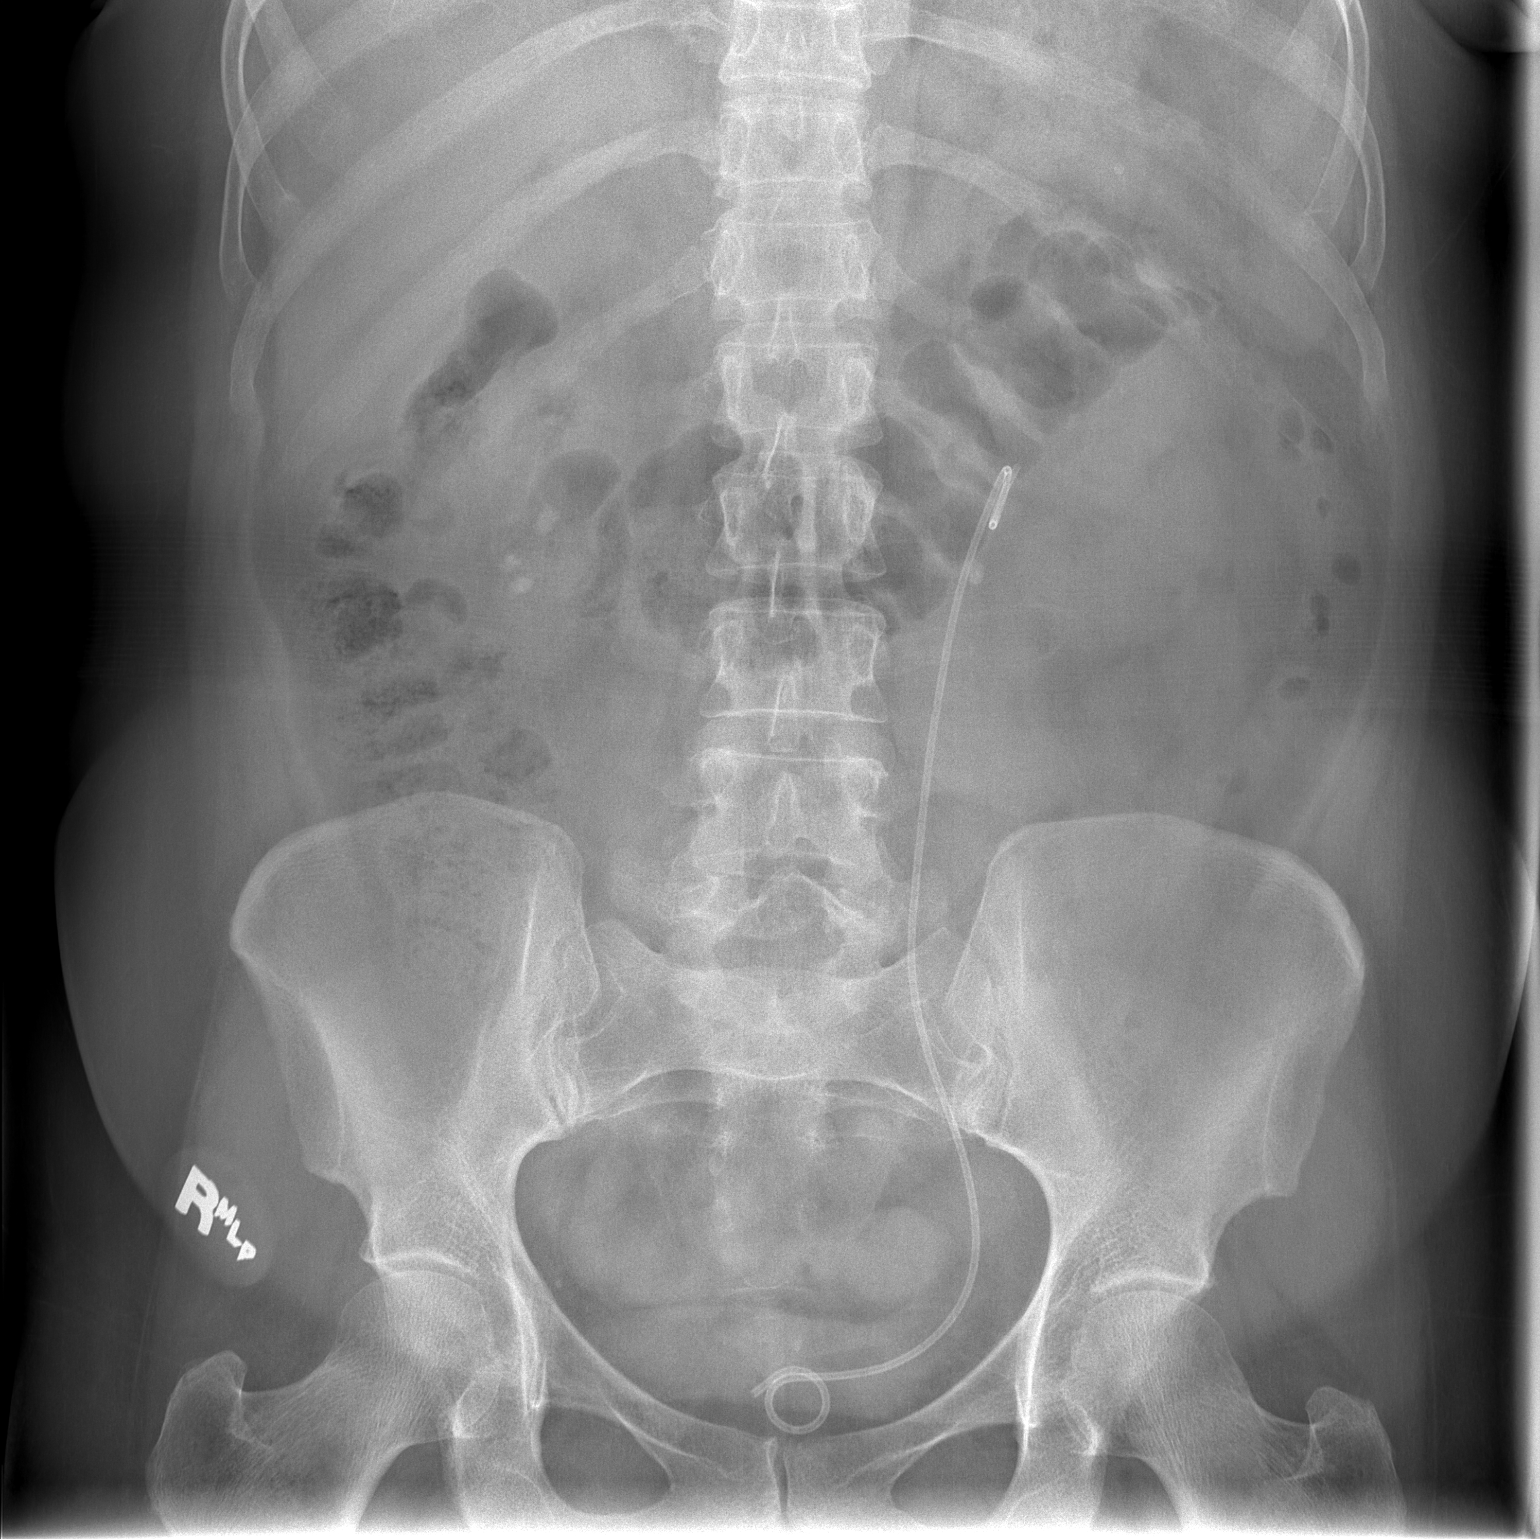

[1 of 1 positions shown; findings below may reference images not displayed]

FINDINGS: There is a double-J stent extending from the left renal pelvis to
the bladder. There are calcifications in the right kidney consistent
with calculi. There is a 3 x 2 mm calculus in the upper pole right
kidney. There is a focal 8 x 5 mm calculus in mid right kidney.
There is a 6 x 5 mm calculus with an adjacent 6 x 5 mm calculus in
the lower pole right kidney. There is a probable tiny phlebolith in
the right pelvis.

There is moderate stool in the colon. There is no bowel dilatation
or air-fluid level to suggest bowel obstruction. No free air.
IMPRESSION: Double-J stent extending from left renal pelvis to bladder. Multiple
calculi in right kidney. No bowel obstruction or free air.

## 2019-04-07 DIAGNOSIS — N3000 Acute cystitis without hematuria: Secondary | ICD-10-CM | POA: Diagnosis not present

## 2019-04-16 DIAGNOSIS — N3281 Overactive bladder: Secondary | ICD-10-CM | POA: Diagnosis not present

## 2019-04-16 DIAGNOSIS — N200 Calculus of kidney: Secondary | ICD-10-CM | POA: Diagnosis not present

## 2019-04-16 DIAGNOSIS — R109 Unspecified abdominal pain: Secondary | ICD-10-CM | POA: Diagnosis not present

## 2019-04-16 DIAGNOSIS — R3129 Other microscopic hematuria: Secondary | ICD-10-CM | POA: Diagnosis not present

## 2019-06-08 DIAGNOSIS — R5383 Other fatigue: Secondary | ICD-10-CM | POA: Diagnosis not present

## 2019-06-08 DIAGNOSIS — M797 Fibromyalgia: Secondary | ICD-10-CM | POA: Diagnosis not present

## 2019-06-08 DIAGNOSIS — I10 Essential (primary) hypertension: Secondary | ICD-10-CM | POA: Diagnosis not present

## 2019-06-08 DIAGNOSIS — E1169 Type 2 diabetes mellitus with other specified complication: Secondary | ICD-10-CM | POA: Diagnosis not present

## 2019-06-08 DIAGNOSIS — F320 Major depressive disorder, single episode, mild: Secondary | ICD-10-CM | POA: Diagnosis not present

## 2019-06-08 DIAGNOSIS — E119 Type 2 diabetes mellitus without complications: Secondary | ICD-10-CM | POA: Diagnosis not present

## 2019-06-08 DIAGNOSIS — E782 Mixed hyperlipidemia: Secondary | ICD-10-CM | POA: Diagnosis not present

## 2019-08-20 DIAGNOSIS — N132 Hydronephrosis with renal and ureteral calculous obstruction: Secondary | ICD-10-CM | POA: Diagnosis not present

## 2019-08-20 DIAGNOSIS — K7460 Unspecified cirrhosis of liver: Secondary | ICD-10-CM | POA: Diagnosis not present

## 2019-08-31 DIAGNOSIS — R160 Hepatomegaly, not elsewhere classified: Secondary | ICD-10-CM | POA: Diagnosis not present

## 2019-08-31 DIAGNOSIS — N200 Calculus of kidney: Secondary | ICD-10-CM | POA: Diagnosis not present

## 2019-10-04 ENCOUNTER — Other Ambulatory Visit: Payer: Self-pay | Admitting: Physician Assistant

## 2019-10-06 ENCOUNTER — Other Ambulatory Visit: Payer: Self-pay | Admitting: Physician Assistant

## 2019-10-15 ENCOUNTER — Other Ambulatory Visit: Payer: Self-pay | Admitting: Physician Assistant

## 2019-11-02 ENCOUNTER — Other Ambulatory Visit: Payer: Self-pay | Admitting: Physician Assistant

## 2019-11-03 ENCOUNTER — Ambulatory Visit: Payer: BC Managed Care – PPO | Admitting: Physician Assistant

## 2019-11-03 ENCOUNTER — Encounter: Payer: Self-pay | Admitting: Physician Assistant

## 2019-11-03 ENCOUNTER — Other Ambulatory Visit: Payer: Self-pay | Admitting: Physician Assistant

## 2019-11-03 ENCOUNTER — Other Ambulatory Visit: Payer: Self-pay

## 2019-11-03 VITALS — BP 126/68 | HR 74 | Temp 97.4°F | Resp 16 | Ht 60.5 in | Wt 142.0 lb

## 2019-11-03 DIAGNOSIS — L739 Follicular disorder, unspecified: Secondary | ICD-10-CM | POA: Diagnosis not present

## 2019-11-03 MED ORDER — BETAMETHASONE VALERATE 0.1 % EX OINT
1.0000 | TOPICAL_OINTMENT | Freq: Two times a day (BID) | CUTANEOUS | 0 refills | Status: DC
Start: 2019-11-03 — End: 2019-11-03

## 2019-11-03 MED ORDER — CEPHALEXIN 500 MG PO CAPS: 500.0000 mg | ORAL_CAPSULE | Freq: Two times a day (BID) | ORAL | 0 refills | Status: DC

## 2019-11-03 MED ORDER — CLOTRIMAZOLE-BETAMETHASONE 1-0.05 % EX CREA: 1.0000 "application " | TOPICAL_CREAM | Freq: Two times a day (BID) | CUTANEOUS | 0 refills | Status: DC

## 2019-11-03 NOTE — Progress Notes (Signed)
Acute Office Visit  Subjective:    Patient ID: Valerie Horn, female    DOB: 08/12/1966, 54 y.o.   MRN: [id number]  Chief Complaint  Patient presents with  . Rash    HPI Patient is in today for rash under right arm  Pt states she has had a red bump under her right arm for a few days that has been itchy - does burn when scratching it - states the lump seems to be getting slightly larger  Past Medical History:  Diagnosis Date  . Anxiety   . Arthritis   . Diabetes mellitus without complication (HCC) 2013   type 2  . Fibromyalgia   . History of kidney stones   . Hypercholesteremia   . Hypertension   . IBS (irritable bowel syndrome)   . Migraine   . Spastic colon     Past Surgical History:  Procedure Laterality Date  . ENDOSCOPIC PLANTAR FASCIOTOMY Bilateral   . EXTRACORPOREAL SHOCK WAVE LITHOTRIPSY Left 05/29/2017   Procedure: LEFT EXTRACORPOREAL SHOCK WAVE LITHOTRIPSY (ESWL);  Surgeon: Debroah Baller, MD;  Location: [facility];  Service: Urology;  Laterality: Left;  Marland Kitchen VAGINAL HYSTERECTOMY    . WRIST SURGERY Right     Family History  Problem Relation Age of Onset  . COPD Mother   . Congestive Heart Failure Mother   . Cancer Maternal Grandmother     Social History   Socioeconomic History  . Marital status: Married    Spouse name: Not on file  . Number of children: 2  . Years of education: Not on file  . Highest education level: Not on file  Occupational History  . Not on file  Tobacco Use  . Smoking status: Current Every Day Smoker    Packs/day: 1.00    Years: 24.00    Pack years: 24.00    Types: Cigarettes    Last attempt to quit: 04/26/2010    Years since quitting: 9.5  . Smokeless tobacco: Never Used  Substance and Sexual Activity  . Alcohol use: No    Alcohol/week: 0.0 standard drinks  . Drug use: No  . Sexual activity: Not on file  Other Topics Concern  . Not on file  Social History Narrative  . Not on file   Social Determinants of Health    Financial Resource Strain:   . Difficulty of Paying Living Expenses: Not on file  Food Insecurity:   . Worried About Programme researcher, broadcasting/film/video in the Last Year: Not on file  . Ran Out of Food in the Last Year: Not on file  Transportation Needs:   . Lack of Transportation (Medical): Not on file  . Lack of Transportation (Non-Medical): Not on file  Physical Activity:   . Days of Exercise per Week: Not on file  . Minutes of Exercise per Session: Not on file  Stress:   . Feeling of Stress : Not on file  Social Connections:   . Frequency of Communication with Friends and Family: Not on file  . Frequency of Social Gatherings with Friends and Family: Not on file  . Attends Religious Services: Not on file  . Active Member of Clubs or Organizations: Not on file  . Attends Banker Meetings: Not on file  . Marital Status: Not on file  Intimate Partner Violence:   . Fear of Current or Ex-Partner: Not on file  . Emotionally Abused: Not on file  . Physically Abused: Not on file  . Sexually Abused:  Not on file     Current Outpatient Medications:  .  atenolol (TENORMIN) 50 MG tablet, Take 50 mg by mouth daily., Disp: , Rfl:  .  buPROPion (WELLBUTRIN XL) 300 MG 24 hr tablet, TAKE 1 TABLET BY MOUTH ONCE DAILY, Disp: 30 tablet, Rfl: 2 .  busPIRone (BUSPAR) 15 MG tablet, Take 15 mg by mouth 2 (two) times daily., Disp: , Rfl:  .  clonazePAM (KLONOPIN) 0.5 MG tablet, Take 0.5 mg by mouth 2 (two) times daily as needed., Disp: , Rfl:  .  DULoxetine (CYMBALTA) 60 MG capsule, , Disp: , Rfl:  .  ezetimibe (ZETIA) 10 MG tablet, TAKE 1 TABLET ONCE DAILY, Disp: 30 tablet, Rfl: 1 .  meloxicam (MOBIC) 15 MG tablet, Take 15 mg by mouth daily., Disp: , Rfl:  .  metFORMIN (GLUCOPHAGE) 1000 MG tablet, TAKE ONE TABLET BY MOUTH 2 TIMES A DAY, Disp: 60 tablet, Rfl: 1 .  pantoprazole (PROTONIX) 40 MG tablet, Take 40 mg by mouth daily., Disp: , Rfl:  .  SUMAtriptan (IMITREX) 100 MG tablet, TAKE ONE TABLET BY  MOUTH AS NEEDED FOR MIGRAINE, Disp: 9 tablet, Rfl: 3 .  VIBERZI 100 MG TABS, Take 1 tablet by mouth twice daily with food, Disp: 180 tablet, Rfl: 0   Allergies  Allergen Reactions  . Celebrex [Celecoxib] Hives  . Morphine And Related     migraine    CONSTITUTIONAL: Negative for chills, fatigue, fever, unintentional weight gain and unintentional weight loss.   CARDIOVASCULAR: Negative for chest pain, dizziness, palpitations and pedal edema.  RESPIRATORY: Negative for recent cough and dyspnea.    MSK: Negative for arthralgias and myalgias.  INTEGUMENTARY: see HPI  NEUROLOGICAL: Negative for dizziness and headaches.  PSYCHIATRIC: Negative for sleep disturbance and to question depression screen.  Negative for depression, negative for anhedonia.         Objective:    PHYSICAL EXAM:   VS: BP 126/68   Pulse 74   Temp (!) 97.4 F (36.3 C)   Resp 16   Ht 5' 0.5" (1.537 m)   Wt 142 lb (64.4 kg)   SpO2 98%   BMI 27.28 kg/m   GEN: Well nourished, well developed, in no acute distress   Cardiac: RRR; no murmurs, rubs, or gallops,no edema - no significant varicosities Respiratory:  normal respiratory rate and pattern with no distress - normal breath sounds with no rales, rhonchi, wheezes or rubs  Skin: small nodule noted under right axillae - red and tender Neuro:  Alert and Oriented x 3, Strength and sensation are intact - CN II-Xii grossly intact Psych: euthymic mood, appropriate affect and demeanor   Wt Readings from Last 3 Encounters:  11/03/19 142 lb (64.4 kg)  05/29/17 155 lb (70.3 kg)  06/21/15 158 lb (71.7 kg)    Health Maintenance Due  Topic Date Due  . HIV Screening  02/24/1982  . TETANUS/TDAP  02/24/1986  . PAP SMEAR-Modifier  02/25/1988  . MAMMOGRAM  02/24/2017  . COLONOSCOPY  02/24/2017  . INFLUENZA VACCINE  04/03/2019    There are no preventive care reminders to display for this patient.        Assessment & Plan:   Problem List Items Addressed  This Visit      Musculoskeletal and Integument   Folliculitis - Primary       No orders of the defined types were placed in this encounter.    SARA R Raenette Sakata, PA-C

## 2019-11-08 ENCOUNTER — Encounter: Payer: Self-pay | Admitting: Physician Assistant

## 2019-11-08 ENCOUNTER — Ambulatory Visit: Payer: BC Managed Care – PPO | Admitting: Physician Assistant

## 2019-11-08 ENCOUNTER — Other Ambulatory Visit: Payer: Self-pay

## 2019-11-08 VITALS — BP 122/80 | HR 74 | Temp 97.4°F | Resp 16 | Wt 140.0 lb

## 2019-11-08 DIAGNOSIS — K219 Gastro-esophageal reflux disease without esophagitis: Secondary | ICD-10-CM | POA: Insufficient documentation

## 2019-11-08 DIAGNOSIS — E119 Type 2 diabetes mellitus without complications: Secondary | ICD-10-CM | POA: Insufficient documentation

## 2019-11-08 DIAGNOSIS — I10 Essential (primary) hypertension: Secondary | ICD-10-CM | POA: Insufficient documentation

## 2019-11-08 DIAGNOSIS — F419 Anxiety disorder, unspecified: Secondary | ICD-10-CM | POA: Insufficient documentation

## 2019-11-08 DIAGNOSIS — E782 Mixed hyperlipidemia: Secondary | ICD-10-CM

## 2019-11-08 MED ORDER — CLONAZEPAM 1 MG PO TABS: 1.0000 mg | ORAL_TABLET | Freq: Two times a day (BID) | ORAL | 1 refills | Status: DC | PRN

## 2019-11-08 MED ORDER — PANTOPRAZOLE SODIUM 40 MG PO TBEC: 40.0000 mg | DELAYED_RELEASE_TABLET | Freq: Every day | ORAL | 1 refills | Status: AC

## 2019-11-08 NOTE — Assessment & Plan Note (Signed)
labwork pending Continue current meds Follow up as scheduled

## 2019-11-08 NOTE — Assessment & Plan Note (Signed)
Well controlled.  ?No changes to medicines.  ?Continue to work on eating a healthy diet and exercise.  ?Labs drawn today.  ?

## 2019-11-08 NOTE — Progress Notes (Signed)
Established Patient Office Visit  Subjective:  Patient ID: Valerie Horn, female    DOB: 01-04-1966  Age: 54 y.o. MRN: [id number]  CC:  Chief Complaint  Patient presents with  . Follow-up  . Diabetes  . Hypertension    HPI [name] presents for follow up of chronic medical problems  Mixed hyperlipidemia  Pt presents with hyperlipidemia. . Compliance with treatment has been good; The patient is compliant with medications, maintains a low cholesterol diet , follows up as directed , and maintains an exercise regimen . The patient denies experiencing any hypercholesterolemia related symptoms.  Evidenced based information based on history , exam, and other sources has been used  for decision making. She is currently taking zetia qd  Pt presents for follow up of hypertension. She is currently taking atenolol 61m The patient is tolerating the medication well without side effects. Compliance with treatment has been good; including taking medication as directed , maintains a healthy diet and regular exercise regimen , and following up as directed Patient was evaluated using exam, physical, labs and other information to perform evidence based decision making.  Pt with history of anxiety - she would like to have klonopin increased to 115mbid - she continues to have panic attacks and 'feels shaky' most of the time - she is also taking wellbutrin and buspar and cymbalta  Pt with history of GERD - requests refill of protonix which controls her symptoms well  Pt with history of NIDDM - she is currently taking metformin 100083m she states her sugars when she takes them have been doing well with values under 130 - she denies any other problems  Past Medical History:  Diagnosis Date  . Anxiety   . Arthritis   . Diabetes mellitus without complication ([id number] 2  . Fibromyalgia   . History of kidney stones   . Hypercholesteremia   . Hypertension   . IBS (irritable bowel  syndrome)   . Migraine   . Spastic colon     Past Surgical History:  Procedure Laterality Date  . ENDOSCOPIC PLANTAR FASCIOTOMY Bilateral   . EXTRACORPOREAL SHOCK WAVE LITHOTRIPSY Left 05/29/2017   Procedure: LEFT EXTRACORPOREAL SHOCK WAVE LITHOTRIPSY (ESWL);  Surgeon: ChaJoie BimlerD;  Location: [facility];  Service: Urology;  Laterality: Left;  . VMarland KitchenGINAL HYSTERECTOMY    . WRIST SURGERY Right     Family History  Problem Relation Age of Onset  . COPD Mother   . Congestive Heart Failure Mother   . Cancer Maternal Grandmother     Social History   Socioeconomic History  . Marital status: Married    Spouse name: Not on file  . Number of children: 2  . Years of education: Not on file  . Highest education level: Not on file  Occupational History  . Not on file  Tobacco Use  . Smoking status: Current Every Day Smoker    Packs/day: 1.00    Years: 24.00    Pack years: 24.00    Types: Cigarettes    Last attempt to quit: 04/26/2010    Years since quitting: 9.5  . Smokeless tobacco: Never Used  Substance and Sexual Activity  . Alcohol use: No    Alcohol/week: 0.0 standard drinks  . Drug use: No  . Sexual activity: Not on file  Other Topics Concern  . Not on file  Social History Narrative  . Not on file   Social Determinants of Health   Financial  Resource Strain:   . Difficulty of Paying Living Expenses: Not on file  Food Insecurity:   . Worried About Charity fundraiser in the Last Year: Not on file  . Ran Out of Food in the Last Year: Not on file  Transportation Needs:   . Lack of Transportation (Medical): Not on file  . Lack of Transportation (Non-Medical): Not on file  Physical Activity:   . Days of Exercise per Week: Not on file  . Minutes of Exercise per Session: Not on file  Stress:   . Feeling of Stress : Not on file  Social Connections:   . Frequency of Communication with Friends and Family: Not on file  . Frequency of Social Gatherings with Friends and  Family: Not on file  . Attends Religious Services: Not on file  . Active Member of Clubs or Organizations: Not on file  . Attends Archivist Meetings: Not on file  . Marital Status: Not on file  Intimate Partner Violence:   . Fear of Current or Ex-Partner: Not on file  . Emotionally Abused: Not on file  . Physically Abused: Not on file  . Sexually Abused: Not on file     Current Outpatient Medications:  .  atenolol (TENORMIN) 50 MG tablet, Take 50 mg by mouth daily., Disp: , Rfl:  .  buPROPion (WELLBUTRIN XL) 300 MG 24 hr tablet, TAKE 1 TABLET BY MOUTH ONCE DAILY, Disp: 30 tablet, Rfl: 2 .  busPIRone (BUSPAR) 15 MG tablet, Take 15 mg by mouth 2 (two) times daily., Disp: , Rfl:  .  cephALEXin (KEFLEX) 500 MG capsule, Take 1 capsule (500 mg total) by mouth 2 (two) times daily., Disp: 20 capsule, Rfl: 0 .  clonazePAM (KLONOPIN) 1 MG tablet, Take 1 tablet (1 mg total) by mouth 2 (two) times daily as needed for anxiety., Disp: 60 tablet, Rfl: 1 .  clotrimazole-betamethasone (LOTRISONE) cream, Apply 1 application topically 2 (two) times daily., Disp: 30 g, Rfl: 0 .  DULoxetine (CYMBALTA) 60 MG capsule, , Disp: , Rfl:  .  ezetimibe (ZETIA) 10 MG tablet, TAKE 1 TABLET ONCE DAILY, Disp: 30 tablet, Rfl: 1 .  meloxicam (MOBIC) 15 MG tablet, Take 15 mg by mouth daily., Disp: , Rfl:  .  metFORMIN (GLUCOPHAGE) 1000 MG tablet, TAKE ONE TABLET BY MOUTH 2 TIMES A DAY, Disp: 60 tablet, Rfl: 1 .  pantoprazole (PROTONIX) 40 MG tablet, Take 1 tablet (40 mg total) by mouth daily., Disp: 90 tablet, Rfl: 1 .  SUMAtriptan (IMITREX) 100 MG tablet, TAKE ONE TABLET BY MOUTH AS NEEDED FOR MIGRAINE, Disp: 9 tablet, Rfl: 3 .  VIBERZI 100 MG TABS, Take 1 tablet by mouth twice daily with food, Disp: 180 tablet, Rfl: 0   Allergies  Allergen Reactions  . Celebrex [Celecoxib] Hives  . Morphine And Related     migraine    ROS CONSTITUTIONAL: Negative for chills, fatigue, fever, unintentional weight gain  and unintentional weight loss.  E/N/T: Negative for ear pain, nasal congestion and sore throat.  CARDIOVASCULAR: Negative for chest pain, dizziness, palpitations and pedal edema.  RESPIRATORY: Negative for recent cough and dyspnea.  GASTROINTESTINAL: Negative for abdominal pain, acid reflux symptoms, constipation, diarrhea, nausea and vomiting.  MSK: Negative for arthralgias and myalgias.  INTEGUMENTARY: Negative for rash.  NEUROLOGICAL: Negative for dizziness and headaches.  PSYCHIATRIC: see HPI       Objective:    PHYSICAL EXAM:   VS: BP 122/80   Pulse 74  Temp (!) 97.4 F (36.3 C)   Resp 16   Wt 140 lb (63.5 kg)   SpO2 96%   BMI 26.89 kg/m   GEN: Well nourished, well developed, in no acute distress  HEENT: normal external ears and nose - normal external auditory canals and TMS - hearing grossly normal - normal nasal mucosa and septum - Lips, Teeth and Gums - normal  Oropharynx - normal mucosa, palate, and posterior pharynx Neck: no JVD or masses - no thyromegaly Cardiac: RRR; no murmurs, rubs, or gallops,no edema - no significant varicosities Respiratory:  normal respiratory rate and pattern with no distress - normal breath sounds with no rales, rhonchi, wheezes or rubs  Skin: warm and dry, no rash  Neuro:  Alert and Oriented x 3, Strength and sensation are intact - CN II-Xii grossly intact Psych: euthymic mood, appropriate affect and demeanor Diabetic Foot Exam - Simple   Simple Foot Form Diabetic Foot exam was performed with the following findings: Yes 11/08/2019  4:04 PM  Visual Inspection No deformities, no ulcerations, no other skin breakdown bilaterally: Yes Sensation Testing Intact to touch and monofilament testing bilaterally: Yes Pulse Check Posterior Tibialis and Dorsalis pulse intact bilaterally: Yes Comments    BP 122/80   Pulse 74   Temp (!) 97.4 F (36.3 C)   Resp 16   Wt 140 lb (63.5 kg)   SpO2 96%   BMI 26.89 kg/m  Wt Readings from Last 3  Encounters:  11/08/19 140 lb (63.5 kg)  11/03/19 142 lb (64.4 kg)  05/29/17 155 lb (70.3 kg)     Health Maintenance Due  Topic Date Due  . HEMOGLOBIN A1C  Dec 09, 1966  . PNEUMOCOCCAL POLYSACCHARIDE VACCINE AGE 31-64 HIGH RISK  02/24/1969  . OPHTHALMOLOGY EXAM  02/24/1977  . URINE MICROALBUMIN  02/24/1977  . HIV Screening  02/24/1982  . TETANUS/TDAP  02/24/1986  . PAP SMEAR-Modifier  02/25/1988  . MAMMOGRAM  02/24/2017  . COLONOSCOPY  02/24/2017    There are no preventive care reminders to display for this patient.  No results found for: TSH No results found for: WBC, HGB, HCT, MCV, PLT No results found for: NA, K, CHLORIDE, CO2, GLUCOSE, BUN, CREATININE, BILITOT, ALKPHOS, AST, ALT, PROT, ALBUMIN, CALCIUM, ANIONGAP, EGFR, GFR No results found for: CHOL No results found for: HDL No results found for: LDLCALC No results found for: TRIG No results found for: CHOLHDL No results found for: HGBA1C    Assessment & Plan:   Problem List Items Addressed This Visit      Cardiovascular and Mediastinum   Benign hypertension    labwork pending Continue current meds Follow up as scheduled        Digestive   Gastroesophageal reflux disease without esophagitis    Refill protonix      Relevant Medications   pantoprazole (PROTONIX) 40 MG tablet     Endocrine   Diabetes mellitus type II, non insulin dependent ([place])    Well controlled.  No changes to medicines.  Continue to work on eating a healthy diet and exercise.  Labs drawn today.       Relevant Orders   CBC with Differential/Platelet   Comprehensive metabolic panel   TSH   Hemoglobin A1c     Other   Anxiety    Increase klonopin to 70m bid Continue other meds as directed      Relevant Orders   TSH   Mixed hyperlipidemia - Primary    Well controlled.  No changes  to medicines.  Continue to work on eating a healthy diet and exercise.  Labs drawn today.       Relevant Orders   Lipid panel      Meds  ordered this encounter  Medications  . clonazePAM (KLONOPIN) 1 MG tablet    Sig: Take 1 tablet (1 mg total) by mouth 2 (two) times daily as needed for anxiety.    Dispense:  60 tablet    Refill:  1    Order Specific Question:   Supervising Provider    AnswerRochel Brome [id number]  . pantoprazole (PROTONIX) 40 MG tablet    Sig: Take 1 tablet (40 mg total) by mouth daily.    Dispense:  90 tablet    Refill:  1    Order Specific Question:   Supervising Provider    Answer:   Shelton Silvas    Follow-up: Return in about 3 months (around 02/08/2020) for chronic fasting follow up.    SARA R Yaeko Fazekas, PA-C

## 2019-11-08 NOTE — Assessment & Plan Note (Signed)
Refill protonix 

## 2019-11-08 NOTE — Assessment & Plan Note (Signed)
Increase klonopin to 1mg  bid Continue other meds as directed

## 2019-11-10 ENCOUNTER — Other Ambulatory Visit: Payer: Self-pay | Admitting: Physician Assistant

## 2019-11-10 LAB — CBC WITH DIFFERENTIAL/PLATELET
Basophils Absolute: 0.1 10*3/uL (ref 0.0–0.2)
Basos: 1 %
EOS (ABSOLUTE): 0.3 10*3/uL (ref 0.0–0.4)
Eos: 5 %
Hematocrit: 42.8 % (ref 34.0–46.6)
Hemoglobin: 14.8 g/dL (ref 11.1–15.9)
Immature Grans (Abs): 0 10*3/uL (ref 0.0–0.1)
Immature Granulocytes: 0 %
Lymphocytes Absolute: 2 10*3/uL (ref 0.7–3.1)
Lymphs: 35 %
MCH: 31 pg (ref 26.6–33.0)
MCHC: 34.6 g/dL (ref 31.5–35.7)
MCV: 90 fL (ref 79–97)
Monocytes Absolute: 0.4 10*3/uL (ref 0.1–0.9)
Monocytes: 6 %
Neutrophils Absolute: 2.9 10*3/uL (ref 1.4–7.0)
Neutrophils: 53 %
Platelets: 223 10*3/uL (ref 150–450)
RBC: 4.77 x10E6/uL (ref 3.77–5.28)
RDW: 11.8 % (ref 11.7–15.4)
WBC: 5.7 10*3/uL (ref 3.4–10.8)

## 2019-11-10 LAB — CARDIOVASCULAR RISK ASSESSMENT

## 2019-11-10 LAB — LIPID PANEL
Chol/HDL Ratio: 3.7 ratio (ref 0.0–4.4)
Cholesterol, Total: 225 mg/dL — ABNORMAL HIGH (ref 100–199)
HDL: 61 mg/dL (ref >39)
LDL Chol Calc (NIH): 141 mg/dL — ABNORMAL HIGH (ref 0–99)
Triglycerides: 132 mg/dL (ref 0–149)
VLDL Cholesterol Cal: 23 mg/dL (ref 5–40)

## 2019-11-10 LAB — COMPREHENSIVE METABOLIC PANEL
ALT: 43 IU/L — ABNORMAL HIGH (ref 0–32)
AST: 30 IU/L (ref 0–40)
Albumin/Globulin Ratio: 1.8 (ref 1.2–2.2)
Albumin: 5 g/dL — ABNORMAL HIGH (ref 3.8–4.9)
Alkaline Phosphatase: 96 IU/L (ref 39–117)
BUN/Creatinine Ratio: 25 — ABNORMAL HIGH (ref 9–23)
BUN: 30 mg/dL — ABNORMAL HIGH (ref 6–24)
Bilirubin Total: 0.3 mg/dL (ref 0.0–1.2)
CO2: 23 mmol/L (ref 20–29)
Calcium: 10.9 mg/dL — ABNORMAL HIGH (ref 8.7–10.2)
Chloride: 100 mmol/L (ref 96–106)
Creatinine, Ser: 1.2 mg/dL — ABNORMAL HIGH (ref 0.57–1.00)
GFR calc Af Amer: 60 mL/min/{1.73_m2} (ref >59)
GFR calc non Af Amer: 52 mL/min/{1.73_m2} — ABNORMAL LOW (ref >59)
Globulin, Total: 2.8 g/dL (ref 1.5–4.5)
Glucose: 100 mg/dL — ABNORMAL HIGH (ref 65–99)
Potassium: 5 mmol/L (ref 3.5–5.2)
Sodium: 140 mmol/L (ref 134–144)
Total Protein: 7.8 g/dL (ref 6.0–8.5)

## 2019-11-10 LAB — TSH: TSH: 1.23 u[IU]/mL (ref 0.450–4.500)

## 2019-11-10 LAB — HEMOGLOBIN A1C
Est. average glucose Bld gHb Est-mCnc: 126 mg/dL
Hgb A1c MFr Bld: 6 % — ABNORMAL HIGH (ref 4.8–5.6)

## 2019-11-10 MED ORDER — ROSUVASTATIN CALCIUM 5 MG PO TABS: 5.0000 mg | ORAL_TABLET | Freq: Every day | ORAL | 3 refills | Status: DC

## 2019-11-22 ENCOUNTER — Other Ambulatory Visit: Payer: BC Managed Care – PPO

## 2019-11-22 ENCOUNTER — Other Ambulatory Visit: Payer: Self-pay

## 2019-11-22 DIAGNOSIS — R944 Abnormal results of kidney function studies: Secondary | ICD-10-CM | POA: Diagnosis not present

## 2019-11-22 LAB — COMPREHENSIVE METABOLIC PANEL
ALT: 57 IU/L — ABNORMAL HIGH (ref 0–32)
AST: 30 IU/L (ref 0–40)
Albumin/Globulin Ratio: 1.6 (ref 1.2–2.2)
Albumin: 4.4 g/dL (ref 3.8–4.9)
Alkaline Phosphatase: 91 IU/L (ref 39–117)
BUN/Creatinine Ratio: 20 (ref 9–23)
BUN: 25 mg/dL — ABNORMAL HIGH (ref 6–24)
Bilirubin Total: 0.2 mg/dL (ref 0.0–1.2)
CO2: 23 mmol/L (ref 20–29)
Calcium: 9.8 mg/dL (ref 8.7–10.2)
Chloride: 106 mmol/L (ref 96–106)
Creatinine, Ser: 1.24 mg/dL — ABNORMAL HIGH (ref 0.57–1.00)
GFR calc Af Amer: 58 mL/min/{1.73_m2} — ABNORMAL LOW (ref >59)
GFR calc non Af Amer: 50 mL/min/{1.73_m2} — ABNORMAL LOW (ref >59)
Globulin, Total: 2.7 g/dL (ref 1.5–4.5)
Glucose: 166 mg/dL — ABNORMAL HIGH (ref 65–99)
Potassium: 5 mmol/L (ref 3.5–5.2)
Sodium: 145 mmol/L — ABNORMAL HIGH (ref 134–144)
Total Protein: 7.1 g/dL (ref 6.0–8.5)

## 2019-11-23 LAB — PARATHYROID HORMONE, INTACT (NO CA): PTH: 29 pg/mL (ref 15–65)

## 2019-11-30 ENCOUNTER — Telehealth: Payer: Self-pay

## 2019-11-30 ENCOUNTER — Other Ambulatory Visit: Payer: Self-pay | Admitting: Physician Assistant

## 2019-11-30 NOTE — Telephone Encounter (Signed)
Called pt with labs  results she states understanding   The labs were the ones that dr cox and looked at and signed off and we did not know to call her.

## 2019-12-10 ENCOUNTER — Other Ambulatory Visit: Payer: Self-pay | Admitting: Physician Assistant

## 2019-12-28 ENCOUNTER — Other Ambulatory Visit: Payer: Self-pay | Admitting: Physician Assistant

## 2020-01-01 ENCOUNTER — Other Ambulatory Visit: Payer: Self-pay | Admitting: Physician Assistant

## 2020-01-04 ENCOUNTER — Encounter: Payer: Self-pay | Admitting: Physician Assistant

## 2020-01-04 ENCOUNTER — Other Ambulatory Visit: Payer: Self-pay

## 2020-01-04 ENCOUNTER — Ambulatory Visit: Payer: BC Managed Care – PPO | Admitting: Physician Assistant

## 2020-01-04 VITALS — BP 140/70 | HR 82 | Temp 97.5°F | Resp 16 | Wt 156.0 lb

## 2020-01-04 DIAGNOSIS — R5381 Other malaise: Secondary | ICD-10-CM

## 2020-01-04 LAB — POCT URINALYSIS DIPSTICK
Bilirubin, UA: NEGATIVE
Blood, UA: NEGATIVE
Glucose, UA: NEGATIVE
Ketones, UA: NEGATIVE
Leukocytes, UA: NEGATIVE
Nitrite, UA: NEGATIVE
Protein, UA: NEGATIVE
Spec Grav, UA: 1.025 (ref 1.010–1.025)
Urobilinogen, UA: 0.2 E.U./dL
pH, UA: 5 (ref 5.0–8.0)

## 2020-01-04 NOTE — Assessment & Plan Note (Signed)
labwork pending Recommend rest and consider counseling for her anxiety

## 2020-01-04 NOTE — Progress Notes (Signed)
Acute Office Visit  Subjective:    Patient ID: Blayne Garlick, female    DOB: [birth year], 54 y.o.   MRN: [id number]  Chief Complaint  Patient presents with  . Fatigue    HPI Patient is in today for malaise Pt states the past few days she has felt tired and fatigued - actually feels better today Michela Pitcher she had some mild urine pressure over the weekend but that has resolved States that she thinks just tired because there is a lot going on at home - people everywhere at her house - dealing with her daughter with disabling anxiety Pt states however her current meds for depression working well for her  Past Medical History:  Diagnosis Date  . Anxiety   . Arthritis   . Diabetes mellitus without complication ([place]) 7035   type 2  . Fibromyalgia   . History of kidney stones   . Hypercholesteremia   . Hypertension   . IBS (irritable bowel syndrome)   . Migraine   . Spastic colon     Past Surgical History:  Procedure Laterality Date  . ENDOSCOPIC PLANTAR FASCIOTOMY Bilateral   . EXTRACORPOREAL SHOCK WAVE LITHOTRIPSY Left 05/29/2017   Procedure: LEFT EXTRACORPOREAL SHOCK WAVE LITHOTRIPSY (ESWL);  Surgeon: Joie Bimler, MD;  Location: [facility];  Service: Urology;  Laterality: Left;  Marland Kitchen VAGINAL HYSTERECTOMY    . WRIST SURGERY Right     Family History  Problem Relation Age of Onset  . COPD Mother   . Congestive Heart Failure Mother   . Cancer Maternal Grandmother     Social History   Socioeconomic History  . Marital status: Married    Spouse name: Not on file  . Number of children: 2  . Years of education: Not on file  . Highest education level: Not on file  Occupational History  . Not on file  Tobacco Use  . Smoking status: Current Every Day Smoker    Packs/day: 1.00    Years: 24.00    Pack years: 24.00    Types: Cigarettes    Last attempt to quit: 04/26/2010    Years since quitting: 9.6  . Smokeless tobacco: Never Used  Substance and Sexual Activity  . Alcohol  use: No    Alcohol/week: 0.0 standard drinks  . Drug use: No  . Sexual activity: Not on file  Other Topics Concern  . Not on file  Social History Narrative  . Not on file   Social Determinants of Health   Financial Resource Strain:   . Difficulty of Paying Living Expenses:   Food Insecurity:   . Worried About Charity fundraiser in the Last Year:   . Arboriculturist in the Last Year:   Transportation Needs:   . Film/video editor (Medical):   Marland Kitchen Lack of Transportation (Non-Medical):   Physical Activity:   . Days of Exercise per Week:   . Minutes of Exercise per Session:   Stress:   . Feeling of Stress :   Social Connections:   . Frequency of Communication with Friends and Family:   . Frequency of Social Gatherings with Friends and Family:   . Attends Religious Services:   . Active Member of Clubs or Organizations:   . Attends Archivist Meetings:   Marland Kitchen Marital Status:   Intimate Partner Violence:   . Fear of Current or Ex-Partner:   . Emotionally Abused:   Marland Kitchen Physically Abused:   . Sexually Abused:  Current Outpatient Medications:  .  atenolol (TENORMIN) 50 MG tablet, TAKE 1 TABLET BY MOUTH ONCE DAILY, Disp: 30 tablet, Rfl: 2 .  buPROPion (WELLBUTRIN XL) 300 MG 24 hr tablet, TAKE 1 TABLET BY MOUTH ONCE DAILY, Disp: 30 tablet, Rfl: 2 .  busPIRone (BUSPAR) 15 MG tablet, TAKE ONE TABLET BY MOUTH 2 TIMES A DAY, Disp: 60 tablet, Rfl: 2 .  clonazePAM (KLONOPIN) 1 MG tablet, TAKE ONE TABLET BY MOUTH 2 TIMES A DAY AS NEEDED FOR ANXIETY, Disp: 60 tablet, Rfl: 1 .  DULoxetine (CYMBALTA) 60 MG capsule, TAKE ONE CAPSULE BY MOUTH 2 TIMES A DAY, Disp: 60 capsule, Rfl: 2 .  meloxicam (MOBIC) 15 MG tablet, TAKE 1 TABLET BY MOUTH ONCE DAILY, Disp: 30 tablet, Rfl: 2 .  metFORMIN (GLUCOPHAGE) 1000 MG tablet, TAKE ONE TABLET BY MOUTH 2 TIMES A DAY, Disp: 60 tablet, Rfl: 1 .  pantoprazole (PROTONIX) 40 MG tablet, Take 1 tablet (40 mg total) by mouth daily., Disp: 90 tablet,  Rfl: 1 .  rosuvastatin (CRESTOR) 5 MG tablet, Take 1 tablet (5 mg total) by mouth daily., Disp: 90 tablet, Rfl: 3 .  SUMAtriptan (IMITREX) 100 MG tablet, TAKE ONE TABLET BY MOUTH AS NEEDED FOR MIGRAINE, Disp: 9 tablet, Rfl: 3 .  VIBERZI 100 MG TABS, Take 1 tablet by mouth twice daily with food, Disp: 180 tablet, Rfl: 0   Allergies  Allergen Reactions  . Celebrex [Celecoxib] Hives  . Morphine And Related     migraine    CONSTITUTIONAL: see HPI E/N/T: Negative for ear pain, nasal congestion and sore throat.  CARDIOVASCULAR: Negative for chest pain, dizziness, palpitations and pedal edema.  RESPIRATORY: Negative for recent cough and dyspnea.  GASTROINTESTINAL: Negative for abdominal pain, acid reflux symptoms, constipation, diarrhea, nausea and vomiting.  GU - see HPI MSK: Negative for arthralgias and myalgias.  INTEGUMENTARY: Negative for rash.  NEUROLOGICAL: Negative for dizziness and headaches.  PSYCHIATRIC: see HPI    Objective:    PHYSICAL EXAM:   VS: BP 140/70   Pulse 82   Temp (!) 97.5 F (36.4 C)   Resp 16   Wt 156 lb (70.8 kg)   SpO2 97%   BMI 29.97 kg/m   GEN: Well nourished, well developed, in no acute distress   Cardiac: RRR; no murmurs, rubs, or gallops,no edema - no significant varicosities Respiratory:  normal respiratory rate and pattern with no distress - normal breath sounds with no rales, rhonchi, wheezes or rubs  Skin: warm and dry, no rash  Neuro:  Alert and Oriented x 3, Strength and sensation are intact - CN II-Xii grossly intact Psych: euthymic mood, appropriate affect and demeanor  Office Visit on 01/04/2020  Component Date Value Ref Range Status  . Color, UA 01/04/2020 yellow   Final  . Clarity, UA 01/04/2020 clear   Final  . Glucose, UA 01/04/2020 Negative  Negative Final  . Bilirubin, UA 01/04/2020 neg   Final  . Ketones, UA 01/04/2020 neg   Final  . Spec Grav, UA 01/04/2020 1.025  1.010 - 1.025 Final  . Blood, UA 01/04/2020 neg   Final    . pH, UA 01/04/2020 5.0  5.0 - 8.0 Final  . Protein, UA 01/04/2020 Negative  Negative Final  . Urobilinogen, UA 01/04/2020 0.2  0.2 or 1.0 E.U./dL Final  . Nitrite, UA 31/54/0086 neg   Final  . Leukocytes, UA 01/04/2020 Negative  Negative Final  . Appearance 01/04/2020 clear   Final  . Odor 01/04/2020 none  Final    Wt Readings from Last 3 Encounters:  01/04/20 156 lb (70.8 kg)  11/08/19 140 lb (63.5 kg)  11/03/19 142 lb (64.4 kg)    Health Maintenance Due  Topic Date Due  . PNEUMOCOCCAL POLYSACCHARIDE VACCINE AGE 46-64 HIGH RISK  Never done  . OPHTHALMOLOGY EXAM  Never done  . URINE MICROALBUMIN  Never done  . HIV Screening  Never done  . COVID-19 Vaccine (1) Never done  . TETANUS/TDAP  Never done  . PAP SMEAR-Modifier  Never done  . MAMMOGRAM  Never done  . COLONOSCOPY  Never done    There are no preventive care reminders to display for this patient.        Assessment & Plan:   Problem List Items Addressed This Visit      Other   Malaise - Primary   Relevant Orders   POCT urinalysis dipstick (Completed)   CBC with Differential/Platelet   Comprehensive metabolic panel   TSH       No orders of the defined types were placed in this encounter.    SARA R Johnnie Goynes, PA-C

## 2020-01-05 LAB — COMPREHENSIVE METABOLIC PANEL
ALT: 54 IU/L — ABNORMAL HIGH (ref 0–32)
AST: 40 IU/L (ref 0–40)
Albumin/Globulin Ratio: 1.7 (ref 1.2–2.2)
Albumin: 4.2 g/dL (ref 3.8–4.9)
Alkaline Phosphatase: 107 IU/L (ref 39–117)
BUN/Creatinine Ratio: 14 (ref 9–23)
BUN: 16 mg/dL (ref 6–24)
Bilirubin Total: 0.3 mg/dL (ref 0.0–1.2)
CO2: 26 mmol/L (ref 20–29)
Calcium: 9.6 mg/dL (ref 8.7–10.2)
Chloride: 100 mmol/L (ref 96–106)
Creatinine, Ser: 1.17 mg/dL — ABNORMAL HIGH (ref 0.57–1.00)
GFR calc Af Amer: 62 mL/min/{1.73_m2} (ref >59)
GFR calc non Af Amer: 54 mL/min/{1.73_m2} — ABNORMAL LOW (ref >59)
Globulin, Total: 2.5 g/dL (ref 1.5–4.5)
Glucose: 162 mg/dL — ABNORMAL HIGH (ref 65–99)
Potassium: 4.8 mmol/L (ref 3.5–5.2)
Sodium: 138 mmol/L (ref 134–144)
Total Protein: 6.7 g/dL (ref 6.0–8.5)

## 2020-01-05 LAB — CBC WITH DIFFERENTIAL/PLATELET
Basophils Absolute: 0.1 10*3/uL (ref 0.0–0.2)
Basos: 1 %
EOS (ABSOLUTE): 0.3 10*3/uL (ref 0.0–0.4)
Eos: 6 %
Hematocrit: 36.8 % (ref 34.0–46.6)
Hemoglobin: 11.8 g/dL (ref 11.1–15.9)
Immature Grans (Abs): 0 10*3/uL (ref 0.0–0.1)
Immature Granulocytes: 0 %
Lymphocytes Absolute: 1.7 10*3/uL (ref 0.7–3.1)
Lymphs: 35 %
MCH: 29.4 pg (ref 26.6–33.0)
MCHC: 32.1 g/dL (ref 31.5–35.7)
MCV: 92 fL (ref 79–97)
Monocytes Absolute: 0.3 10*3/uL (ref 0.1–0.9)
Monocytes: 7 %
Neutrophils Absolute: 2.5 10*3/uL (ref 1.4–7.0)
Neutrophils: 51 %
Platelets: 194 10*3/uL (ref 150–450)
RBC: 4.01 x10E6/uL (ref 3.77–5.28)
RDW: 13 % (ref 11.7–15.4)
WBC: 4.8 10*3/uL (ref 3.4–10.8)

## 2020-01-05 LAB — TSH: TSH: 1.13 u[IU]/mL (ref 0.450–4.500)

## 2020-01-25 ENCOUNTER — Encounter: Payer: Self-pay | Admitting: Physician Assistant

## 2020-01-25 ENCOUNTER — Ambulatory Visit: Payer: BC Managed Care – PPO | Admitting: Physician Assistant

## 2020-01-25 ENCOUNTER — Other Ambulatory Visit: Payer: Self-pay

## 2020-01-25 VITALS — BP 118/82 | HR 90 | Temp 97.9°F | Ht 60.5 in | Wt 163.0 lb

## 2020-01-25 DIAGNOSIS — R42 Dizziness and giddiness: Secondary | ICD-10-CM | POA: Diagnosis not present

## 2020-01-25 DIAGNOSIS — N200 Calculus of kidney: Secondary | ICD-10-CM | POA: Insufficient documentation

## 2020-01-25 DIAGNOSIS — H814 Vertigo of central origin: Secondary | ICD-10-CM | POA: Diagnosis not present

## 2020-01-25 DIAGNOSIS — R413 Other amnesia: Secondary | ICD-10-CM | POA: Insufficient documentation

## 2020-01-25 LAB — POCT URINALYSIS DIPSTICK
Bilirubin, UA: NEGATIVE
Blood, UA: NEGATIVE
Glucose, UA: POSITIVE — AB
Ketones, UA: NEGATIVE
Leukocytes, UA: NEGATIVE
Nitrite, UA: NEGATIVE
Protein, UA: POSITIVE — AB
Spec Grav, UA: 1.02 (ref 1.010–1.025)
Urobilinogen, UA: NEGATIVE E.U./dL — AB
pH, UA: 6 (ref 5.0–8.0)

## 2020-01-25 NOTE — Assessment & Plan Note (Signed)
labwork pending  Head CT ordered Will await results before further treatment recommended

## 2020-01-25 NOTE — Assessment & Plan Note (Signed)
Head CT ordered labwork pending

## 2020-01-25 NOTE — Progress Notes (Signed)
Acute Office Visit  Subjective:    Patient ID: Valerie Horn, female    DOB: Nov 02, 1965, 54 y.o.   MRN: [id number]  Chief Complaint  Patient presents with  . Gait Problem    X 1 WEEK    HPI Patient is in today for "staggery" Patient states that 3 days ago she was at home and got up and was walking around and was 'staggery' having to hold onto things to walk around She states she really did not remember what all went on that evening and says she has had a lapse in her memory for that day Does state that her family had to help her to bed because of her being 'staggery' She denies feeling bad at all - no chest pain - no dyspnea, only occasional slight dizziness States she is taking her meds as directed and has not taken any other meds On Sunday she actually states she was driving her family around and they made her pull over because she did not seem to be driving in the lines - I ASKED PATIENT WHY HER FAMILY THAT HELPED HER TO BED AND SAID SHE WAS [place]?! She answered maybe because she told them she was fine and felt fine Today she states that intermittently she feels a little 'staggery' as well  Past Medical History:  Diagnosis Date  . Anxiety   . Arthritis   . Diabetes mellitus without complication ([place]) 4098   type 2  . Fibromyalgia   . History of kidney stones   . Hypercholesteremia   . Hypertension   . IBS (irritable bowel syndrome)   . Migraine   . Spastic colon     Past Surgical History:  Procedure Laterality Date  . ENDOSCOPIC PLANTAR FASCIOTOMY Bilateral   . EXTRACORPOREAL SHOCK WAVE LITHOTRIPSY Left 05/29/2017   Procedure: LEFT EXTRACORPOREAL SHOCK WAVE LITHOTRIPSY (ESWL);  Surgeon: Joie Bimler, MD;  Location: [facility];  Service: Urology;  Laterality: Left;  Marland Kitchen VAGINAL HYSTERECTOMY    . WRIST SURGERY Right     Family History  Problem Relation Age of Onset  . COPD Mother   . Congestive Heart Failure Mother    . Cancer Maternal Grandmother     Social History   Socioeconomic History  . Marital status: Married    Spouse name: Not on file  . Number of children: 2  . Years of education: Not on file  . Highest education level: Not on file  Occupational History  . Not on file  Tobacco Use  . Smoking status: Current Every Day Smoker    Packs/day: 1.00    Years: 24.00    Pack years: 24.00    Types: Cigarettes    Last attempt to quit: 04/26/2010    Years since quitting: 9.7  . Smokeless tobacco: Never Used  Substance and Sexual Activity  . Alcohol use: No    Alcohol/week: 0.0 standard drinks  . Drug use: No  . Sexual activity: Not on file  Other Topics Concern  . Not on file  Social History Narrative  . Not on file   Social Determinants of Health   Financial Resource Strain:   . Difficulty of Paying Living Expenses:   Food Insecurity:   . Worried About Charity fundraiser in the Last Year:   . Arboriculturist in the Last Year:   Transportation Needs:   . Film/video editor (Medical):   Marland Kitchen Lack  of Transportation (Non-Medical):   Physical Activity:   . Days of Exercise per Week:   . Minutes of Exercise per Session:   Stress:   . Feeling of Stress :   Social Connections:   . Frequency of Communication with Friends and Family:   . Frequency of Social Gatherings with Friends and Family:   . Attends Religious Services:   . Active Member of Clubs or Organizations:   . Attends Banker Meetings:   Marland Kitchen Marital Status:   Intimate Partner Violence:   . Fear of Current or Ex-Partner:   . Emotionally Abused:   Marland Kitchen Physically Abused:   . Sexually Abused:      Current Outpatient Medications:  .  atenolol (TENORMIN) 50 MG tablet, TAKE 1 TABLET BY MOUTH ONCE DAILY, Disp: 30 tablet, Rfl: 2 .  buPROPion (WELLBUTRIN XL) 300 MG 24 hr tablet, TAKE 1 TABLET BY MOUTH ONCE DAILY, Disp: 30 tablet, Rfl: 2 .  busPIRone (BUSPAR) 15 MG tablet, TAKE ONE TABLET BY MOUTH 2 TIMES A DAY,  Disp: 60 tablet, Rfl: 2 .  clonazePAM (KLONOPIN) 1 MG tablet, TAKE ONE TABLET BY MOUTH 2 TIMES A DAY AS NEEDED FOR ANXIETY, Disp: 60 tablet, Rfl: 1 .  DULoxetine (CYMBALTA) 60 MG capsule, TAKE ONE CAPSULE BY MOUTH 2 TIMES A DAY, Disp: 60 capsule, Rfl: 2 .  meloxicam (MOBIC) 15 MG tablet, TAKE 1 TABLET BY MOUTH ONCE DAILY, Disp: 30 tablet, Rfl: 2 .  metFORMIN (GLUCOPHAGE) 1000 MG tablet, TAKE ONE TABLET BY MOUTH 2 TIMES A DAY, Disp: 60 tablet, Rfl: 1 .  pantoprazole (PROTONIX) 40 MG tablet, Take 1 tablet (40 mg total) by mouth daily., Disp: 90 tablet, Rfl: 1 .  rosuvastatin (CRESTOR) 5 MG tablet, Take 1 tablet (5 mg total) by mouth daily., Disp: 90 tablet, Rfl: 3 .  SUMAtriptan (IMITREX) 100 MG tablet, TAKE ONE TABLET BY MOUTH AS NEEDED FOR MIGRAINE, Disp: 9 tablet, Rfl: 3 .  VIBERZI 100 MG TABS, Take 1 tablet by mouth twice daily with food, Disp: 180 tablet, Rfl: 0   Allergies  Allergen Reactions  . Celebrex [Celecoxib] Hives  . Morphine And Related     migraine    CONSTITUTIONAL: Negative for chills, fatigue, fever, unintentional weight gain and unintentional weight loss.  E/N/T: Negative for ear pain, nasal congestion and sore throat.  CARDIOVASCULAR: Negative for chest pain, dizziness, palpitations and pedal edema.  RESPIRATORY: Negative for recent cough and dyspnea.  GASTROINTESTINAL: Negative for abdominal pain, acid reflux symptoms, constipation, diarrhea, nausea and vomiting.  MSK: Negative for arthralgias and myalgias.  INTEGUMENTARY: Negative for rash.  NEUROLOGICAL: see HPI ---  No vision changes no headaches      Objective:    PHYSICAL EXAM:   VS: BP 118/82 (BP Location: Left Arm, Patient Position: Sitting)   Pulse 90   Temp 97.9 F (36.6 C) (Temporal)   Ht 5' 0.5" (1.537 m)   Wt 163 lb (73.9 kg)   SpO2 98%   BMI 31.31 kg/m   GEN: Well nourished, well developed, in no acute distress  HEENT: normal external ears and nose - normal external auditory canals and TMS -  Oropharynx - normal mucosa, palate, and posterior pharynx Neck: no JVD or masses - no carotid bruits Cardiac: RRR; no murmurs, rubs, or gallops,no edema - Respiratory:  normal respiratory rate and pattern with no distress - normal breath sounds with no rales, rhonchi, wheezes or rubs  Skin: warm and dry, no rash  Neuro:  Alert and Oriented x 3, Strength and sensation are intact - CN II-Xii grossly intact - Gait normal - cerebellar test normal Psych: euthymic mood, appropriate affect and demeanor   Wt Readings from Last 3 Encounters:  01/25/20 163 lb (73.9 kg)  01/04/20 156 lb (70.8 kg)  11/08/19 140 lb (63.5 kg)    Health Maintenance Due  Topic Date Due  . PNEUMOCOCCAL POLYSACCHARIDE VACCINE AGE 46-64 HIGH RISK  Never done  . OPHTHALMOLOGY EXAM  Never done  . URINE MICROALBUMIN  Never done  . COVID-19 Vaccine (1) Never done  . HIV Screening  Never done  . TETANUS/TDAP  Never done  . PAP SMEAR-Modifier  Never done  . MAMMOGRAM  Never done  . COLONOSCOPY  Never done    There are no preventive care reminders to display for this patient.        Assessment & Plan:   Problem List Items Addressed This Visit      Other   Dizziness - Primary    labwork pending  Head CT ordered Will await results before further treatment recommended      Relevant Orders   POCT urinalysis dipstick (Completed)   CBC with Differential/Platelet   Comprehensive metabolic panel   TSH   Transient memory loss    Head CT ordered labwork pending       Other Visit Diagnoses    Vertigo of central origin       Relevant Orders   CT Head Wo Contrast       No orders of the defined types were placed in this encounter.    SARA R DAVIS, PA-C

## 2020-01-26 ENCOUNTER — Other Ambulatory Visit: Payer: Self-pay | Admitting: Physician Assistant

## 2020-01-26 DIAGNOSIS — Z8673 Personal history of transient ischemic attack (TIA), and cerebral infarction without residual deficits: Secondary | ICD-10-CM

## 2020-01-26 LAB — CBC WITH DIFFERENTIAL/PLATELET
Basophils Absolute: 0.1 10*3/uL (ref 0.0–0.2)
Basos: 1 %
EOS (ABSOLUTE): 0.5 10*3/uL — ABNORMAL HIGH (ref 0.0–0.4)
Eos: 7 %
Hematocrit: 37.1 % (ref 34.0–46.6)
Hemoglobin: 11.8 g/dL (ref 11.1–15.9)
Immature Grans (Abs): 0 10*3/uL (ref 0.0–0.1)
Immature Granulocytes: 1 %
Lymphocytes Absolute: 2.4 10*3/uL (ref 0.7–3.1)
Lymphs: 34 %
MCH: 29.5 pg (ref 26.6–33.0)
MCHC: 31.8 g/dL (ref 31.5–35.7)
MCV: 93 fL (ref 79–97)
Monocytes Absolute: 0.5 10*3/uL (ref 0.1–0.9)
Monocytes: 7 %
Neutrophils Absolute: 3.6 10*3/uL (ref 1.4–7.0)
Neutrophils: 50 %
Platelets: 196 10*3/uL (ref 150–450)
RBC: 4 x10E6/uL (ref 3.77–5.28)
RDW: 13 % (ref 11.7–15.4)
WBC: 7 10*3/uL (ref 3.4–10.8)

## 2020-01-26 LAB — COMPREHENSIVE METABOLIC PANEL
ALT: 134 IU/L — ABNORMAL HIGH (ref 0–32)
AST: 85 IU/L — ABNORMAL HIGH (ref 0–40)
Albumin/Globulin Ratio: 1.5 (ref 1.2–2.2)
Albumin: 4.2 g/dL (ref 3.8–4.9)
Alkaline Phosphatase: 141 IU/L — ABNORMAL HIGH (ref 48–121)
BUN/Creatinine Ratio: 22 (ref 9–23)
BUN: 22 mg/dL (ref 6–24)
Bilirubin Total: 0.2 mg/dL (ref 0.0–1.2)
CO2: 21 mmol/L (ref 20–29)
Calcium: 8.9 mg/dL (ref 8.7–10.2)
Chloride: 103 mmol/L (ref 96–106)
Creatinine, Ser: 1.02 mg/dL — ABNORMAL HIGH (ref 0.57–1.00)
GFR calc Af Amer: 73 mL/min/{1.73_m2} (ref >59)
GFR calc non Af Amer: 63 mL/min/{1.73_m2} (ref >59)
Globulin, Total: 2.8 g/dL (ref 1.5–4.5)
Glucose: 197 mg/dL — ABNORMAL HIGH (ref 65–99)
Potassium: 4.7 mmol/L (ref 3.5–5.2)
Sodium: 139 mmol/L (ref 134–144)
Total Protein: 7 g/dL (ref 6.0–8.5)

## 2020-01-26 LAB — TSH: TSH: 2.4 u[IU]/mL (ref 0.450–4.500)

## 2020-01-26 MED ORDER — MELOXICAM 15 MG PO TABS: 15.0000 mg | ORAL_TABLET | Freq: Every day | ORAL | 2 refills | Status: AC

## 2020-02-03 ENCOUNTER — Other Ambulatory Visit: Payer: Self-pay

## 2020-02-03 ENCOUNTER — Encounter: Payer: Self-pay | Admitting: Physician Assistant

## 2020-02-03 ENCOUNTER — Ambulatory Visit: Payer: BC Managed Care – PPO | Admitting: Physician Assistant

## 2020-02-03 VITALS — BP 118/72 | HR 97 | Temp 97.7°F | Ht 60.5 in | Wt 163.0 lb

## 2020-02-03 DIAGNOSIS — E782 Mixed hyperlipidemia: Secondary | ICD-10-CM

## 2020-02-03 DIAGNOSIS — Z8673 Personal history of transient ischemic attack (TIA), and cerebral infarction without residual deficits: Secondary | ICD-10-CM

## 2020-02-03 DIAGNOSIS — R413 Other amnesia: Secondary | ICD-10-CM | POA: Diagnosis not present

## 2020-02-03 NOTE — Assessment & Plan Note (Signed)
Will refer to neurology for further evaluation 

## 2020-02-03 NOTE — Progress Notes (Signed)
Acute Office Visit  Subjective:    Patient ID: Valerie Horn, female    DOB: 04/13/1966, 54 y.o.   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 or forget what she is doing' Last labwork done in office was stable Pt mentions she is drinking Public librarian energy drinks daily which she has been advised against  Past Medical History:  Diagnosis Date   Anxiety    Arthritis    Diabetes mellitus without complication ([facility]) 2013   type 2   Fibromyalgia    History of kidney stones    Hypercholesteremia    Hypertension    IBS (irritable bowel syndrome)    Migraine    Spastic colon     Past Surgical History:  Procedure Laterality Date   ENDOSCOPIC PLANTAR FASCIOTOMY Bilateral    EXTRACORPOREAL SHOCK WAVE LITHOTRIPSY Left 05/29/2017   Procedure: LEFT EXTRACORPOREAL SHOCK WAVE LITHOTRIPSY (ESWL);  Surgeon: Debroah Baller, MD;  Location: [facility];  Service: Urology;  Laterality: Left;   VAGINAL HYSTERECTOMY     WRIST SURGERY Right     Family History  Problem Relation Age of Onset   COPD Mother    Congestive Heart Failure Mother    Cancer Maternal Grandmother     Social History   Socioeconomic History   Marital status: Married    Spouse name: Not on file   Number of children: 2   Years of education: Not on file   Highest education level: Not on file  Occupational History   Not on file  Tobacco Use   Smoking status: Current Every Day Smoker     Packs/day: 1.00    Years: 24.00    Pack years: 24.00    Types: Cigarettes    Last attempt to quit: 04/26/2010    Years since quitting: 9.7   Smokeless tobacco: Never Used  Substance and Sexual Activity   Alcohol use: No    Alcohol/week: 0.0 standard drinks   Drug use: No   Sexual activity: Not on file  Other Topics Concern   Not on file  Social History Narrative   Not on file   Social Determinants of Health   Financial Resource Strain:    Difficulty of Paying Living Expenses:   Food Insecurity:    Worried About Programme researcher, broadcasting/film/video in the Last Year:    Barista in the Last Year:   Transportation Needs:    Freight forwarder (Medical):    Lack of Transportation (Non-Medical):   Physical Activity:    Days of Exercise per Week:    Minutes of Exercise per Session:   Stress:    Feeling of Stress :   Social Connections:    Frequency of Communication with Friends and Family:    Frequency of Social Gatherings with Friends and Family:    Attends Religious Services:    Active Member of Clubs or Organizations:    Attends  Music therapist:    Marital Status:   Intimate Partner Violence:    Fear of Current or Ex-Partner:    Emotionally Abused:    Physically Abused:    Sexually Abused:      Current Outpatient Medications:    atenolol (TENORMIN) 50 MG tablet, TAKE 1 TABLET BY MOUTH ONCE DAILY, Disp: 30 tablet, Rfl: 2   buPROPion (WELLBUTRIN XL) 300 MG 24 hr tablet, TAKE 1 TABLET BY MOUTH ONCE DAILY, Disp: 30 tablet, Rfl: 2   busPIRone (BUSPAR) 15 MG tablet, TAKE ONE TABLET BY MOUTH 2 TIMES A DAY, Disp: 60 tablet, Rfl: 2   clonazePAM (KLONOPIN) 1 MG tablet, TAKE ONE TABLET BY MOUTH 2 TIMES A DAY AS NEEDED FOR ANXIETY, Disp: 60 tablet, Rfl: 1   DULoxetine (CYMBALTA) 60 MG capsule, TAKE ONE CAPSULE BY MOUTH 2 TIMES A DAY, Disp: 60 capsule, Rfl: 2   meloxicam (MOBIC) 15 MG tablet, Take 1 tablet (15 mg total) by mouth daily., Disp:  30 tablet, Rfl: 2   metFORMIN (GLUCOPHAGE) 1000 MG tablet, TAKE ONE TABLET BY MOUTH 2 TIMES A DAY, Disp: 60 tablet, Rfl: 1   pantoprazole (PROTONIX) 40 MG tablet, Take 1 tablet (40 mg total) by mouth daily., Disp: 90 tablet, Rfl: 1   rosuvastatin (CRESTOR) 5 MG tablet, Take 1 tablet (5 mg total) by mouth daily., Disp: 90 tablet, Rfl: 3   SUMAtriptan (IMITREX) 100 MG tablet, TAKE ONE TABLET BY MOUTH AS NEEDED FOR MIGRAINE, Disp: 9 tablet, Rfl: 3   VIBERZI 100 MG TABS, Take 1 tablet by mouth twice daily with food, Disp: 180 tablet, Rfl: 0   Allergies  Allergen Reactions   Celebrex [Celecoxib] Hives   Morphine And Related     migraine    CONSTITUTIONAL: Negative for chills, fatigue, fever, unintentional weight gain and unintentional weight loss.  E/N/T: Negative for ear pain, nasal congestion and sore throat.  CARDIOVASCULAR: Negative for chest pain, dizziness, palpitations and pedal edema.  RESPIRATORY: Negative for recent cough and dyspnea.  GASTROINTESTINAL: Negative for abdominal pain, acid reflux symptoms, constipation, diarrhea, nausea and vomiting.  MSK: Negative for arthralgias and myalgias.  INTEGUMENTARY: Negative for rash.  NEUROLOGICAL: see HPI ---  No vision changes no headaches      Objective:    PHYSICAL EXAM:   VS: BP 118/72 (BP Location: Right Arm, Patient Position: Sitting)    Pulse 97    Temp 97.7 F (36.5 C) (Temporal)    Ht 5' 0.5" (1.537 m)    Wt 163 lb (73.9 kg)    SpO2 98%    BMI 31.31 kg/m   GEN: Well nourished, well developed, in no acute distress   Neck: no JVD or masses - no carotid bruits Cardiac: RRR; no murmurs, rubs, or gallops,no edema - Respiratory:  normal respiratory rate and pattern with no distress - normal breath sounds with no rales, rhonchi, wheezes or rubs  Skin: warm and dry, no rash  Neuro:  Alert and Oriented x 3, Strength and sensation are intact - CN II-Xii grossly intact - Gait normal - cerebellar test normal - NEURO EXAM  NORMAL Psych: euthymic mood, appropriate affect and demeanor   Wt Readings from Last 3 Encounters:  02/03/20 163 lb (73.9 kg)  01/25/20 163 lb (73.9 kg)  01/04/20 156 lb (70.8 kg)    Health Maintenance Due  Topic Date Due   PNEUMOCOCCAL POLYSACCHARIDE VACCINE AGE 51-64 HIGH RISK  Never done   OPHTHALMOLOGY EXAM  Never done  URINE MICROALBUMIN  Never done   COVID-19 Vaccine (1) Never done   HIV Screening  Never done   TETANUS/TDAP  Never done   PAP SMEAR-Modifier  Never done   MAMMOGRAM  Never done   COLONOSCOPY  Never done    There are no preventive care reminders to display for this patient.        Assessment & Plan:   Problem List Items Addressed This Visit      Other   Mixed hyperlipidemia - Primary    labwork pending Continue current meds as directed      Relevant Orders   Comprehensive metabolic panel   Lipid panel   Transient memory loss    Will refer to neurology for further evaluation      Relevant Orders   Ambulatory referral to Neurology   History of cerebellar stroke    Continue ASA qd and management of lipids      Relevant Orders   Ambulatory referral to Neurology       No orders of the defined types were placed in this encounter.    SARA R Kerrick Miler, PA-C

## 2020-02-03 NOTE — Assessment & Plan Note (Signed)
Continue ASA qd and management of lipids

## 2020-02-03 NOTE — Assessment & Plan Note (Signed)
labwork pending Continue current meds as directed 

## 2020-02-04 LAB — COMPREHENSIVE METABOLIC PANEL
ALT: 50 IU/L — ABNORMAL HIGH (ref 0–32)
AST: 33 IU/L (ref 0–40)
Albumin/Globulin Ratio: 2.1 (ref 1.2–2.2)
Albumin: 4.6 g/dL (ref 3.8–4.9)
Alkaline Phosphatase: 103 IU/L (ref 48–121)
BUN/Creatinine Ratio: 19 (ref 9–23)
BUN: 22 mg/dL (ref 6–24)
Bilirubin Total: <0.2 mg/dL (ref 0.0–1.2)
CO2: 24 mmol/L (ref 20–29)
Calcium: 9.5 mg/dL (ref 8.7–10.2)
Chloride: 98 mmol/L (ref 96–106)
Creatinine, Ser: 1.13 mg/dL — ABNORMAL HIGH (ref 0.57–1.00)
GFR calc Af Amer: 65 mL/min/{1.73_m2} (ref >59)
GFR calc non Af Amer: 56 mL/min/{1.73_m2} — ABNORMAL LOW (ref >59)
Globulin, Total: 2.2 g/dL (ref 1.5–4.5)
Glucose: 204 mg/dL — ABNORMAL HIGH (ref 65–99)
Potassium: 4.9 mmol/L (ref 3.5–5.2)
Sodium: 136 mmol/L (ref 134–144)
Total Protein: 6.8 g/dL (ref 6.0–8.5)

## 2020-02-04 LAB — LIPID PANEL
Chol/HDL Ratio: 3.4 ratio (ref 0.0–4.4)
Cholesterol, Total: 168 mg/dL (ref 100–199)
HDL: 49 mg/dL (ref >39)
LDL Chol Calc (NIH): 94 mg/dL (ref 0–99)
Triglycerides: 140 mg/dL (ref 0–149)
VLDL Cholesterol Cal: 25 mg/dL (ref 5–40)

## 2020-02-04 LAB — CARDIOVASCULAR RISK ASSESSMENT

## 2020-02-15 ENCOUNTER — Encounter: Payer: Self-pay | Admitting: Physician Assistant

## 2020-02-15 ENCOUNTER — Other Ambulatory Visit: Payer: Self-pay | Admitting: Physician Assistant

## 2020-02-15 ENCOUNTER — Ambulatory Visit: Payer: BC Managed Care – PPO | Admitting: Physician Assistant

## 2020-02-15 ENCOUNTER — Other Ambulatory Visit: Payer: Self-pay

## 2020-02-15 VITALS — BP 116/76 | HR 94 | Temp 97.5°F | Ht 60.5 in | Wt 164.8 lb

## 2020-02-15 DIAGNOSIS — M545 Low back pain, unspecified: Secondary | ICD-10-CM

## 2020-02-15 DIAGNOSIS — R269 Unspecified abnormalities of gait and mobility: Secondary | ICD-10-CM

## 2020-02-15 DIAGNOSIS — R42 Dizziness and giddiness: Secondary | ICD-10-CM

## 2020-02-15 NOTE — Assessment & Plan Note (Signed)
Range of motion exercises Ice therapy

## 2020-02-15 NOTE — Patient Instructions (Signed)

## 2020-02-15 NOTE — Assessment & Plan Note (Signed)
Will refer to physical therapy  Follow up with neurology as scheduled

## 2020-02-15 NOTE — Assessment & Plan Note (Signed)
Recommend no driving Follow up with neurologist on 03/29/20 as scheduled

## 2020-02-15 NOTE — Progress Notes (Addendum)
Acute Office Visit  Subjective:    Patient ID: Valerie Horn, female    DOB: 1965/11/17, 54 y.o.   MRN: [id number]  Chief Complaint  Patient presents with  . Follow-up    3 weeks    HPI Patient is in for follow up today for "staggery" Patient has been set up to see neurology on 7/28 - overall she states she has good days and bad days - at times feels 'staggery' when she walks - occasional dizziness Denies any other neurological symptoms Of note head CT showed old lacunar infarct  Pt complains of low back pain after standing long periods of time - states bothers her when standing in kitchen preparing food - denies pain down legs   Past Medical History:  Diagnosis Date  . Anxiety   . Arthritis   . Diabetes mellitus without complication ([place]) 2330   type 2  . Fibromyalgia   . History of kidney stones   . Hypercholesteremia   . Hypertension   . IBS (irritable bowel syndrome)   . Migraine   . Spastic colon     Past Surgical History:  Procedure Laterality Date  . ENDOSCOPIC PLANTAR FASCIOTOMY Bilateral   . EXTRACORPOREAL SHOCK WAVE LITHOTRIPSY Left 05/29/2017   Procedure: LEFT EXTRACORPOREAL SHOCK WAVE LITHOTRIPSY (ESWL);  Surgeon: Joie Bimler, MD;  Location: [facility];  Service: Urology;  Laterality: Left;  Marland Kitchen VAGINAL HYSTERECTOMY    . WRIST SURGERY Right     Family History  Problem Relation Age of Onset  . COPD Mother   . Congestive Heart Failure Mother   . Cancer Maternal Grandmother     Social History   Socioeconomic History  . Marital status: Married    Spouse name: Not on file  . Number of children: 2  . Years of education: Not on file  . Highest education level: Not on file  Occupational History  . Not on file  Tobacco Use  . Smoking status: Current Every Day Smoker    Packs/day: 1.00    Years: 24.00    Pack years: 24.00    Types: Cigarettes    Last attempt to quit: 04/26/2010    Years since quitting: 9.8  . Smokeless tobacco: Never Used  Vaping  Use  . Vaping Use: Never used  Substance and Sexual Activity  . Alcohol use: No    Alcohol/week: 0.0 standard drinks  . Drug use: No  . Sexual activity: Not on file  Other Topics Concern  . Not on file  Social History Narrative  . Not on file   Social Determinants of Health   Financial Resource Strain:   . Difficulty of Paying Living Expenses:   Food Insecurity:   . Worried About Charity fundraiser in the Last Year:   . Arboriculturist in the Last Year:   Transportation Needs:   . Film/video editor (Medical):   Marland Kitchen Lack of Transportation (Non-Medical):   Physical Activity:   . Days of Exercise per Week:   . Minutes of Exercise per Session:   Stress:   . Feeling of Stress :   Social Connections:   . Frequency of Communication with Friends and Family:   . Frequency of Social Gatherings with Friends and Family:   . Attends Religious Services:   . Active Member of Clubs or Organizations:   . Attends Archivist Meetings:   Marland Kitchen Marital Status:   Intimate Partner Violence:   . Fear of Current  or Ex-Partner:   . Emotionally Abused:   Marland Kitchen Physically Abused:   . Sexually Abused:      Current Outpatient Medications:  .  atenolol (TENORMIN) 50 MG tablet, TAKE 1 TABLET BY MOUTH ONCE DAILY, Disp: 30 tablet, Rfl: 2 .  buPROPion (WELLBUTRIN XL) 300 MG 24 hr tablet, TAKE 1 TABLET BY MOUTH ONCE DAILY, Disp: 30 tablet, Rfl: 2 .  busPIRone (BUSPAR) 15 MG tablet, TAKE ONE TABLET BY MOUTH 2 TIMES A DAY, Disp: 60 tablet, Rfl: 2 .  clonazePAM (KLONOPIN) 1 MG tablet, TAKE ONE TABLET BY MOUTH 2 TIMES A DAY AS NEEDED FOR ANXIETY, Disp: 60 tablet, Rfl: 1 .  DULoxetine (CYMBALTA) 60 MG capsule, TAKE ONE CAPSULE BY MOUTH 2 TIMES A DAY, Disp: 60 capsule, Rfl: 2 .  meloxicam (MOBIC) 15 MG tablet, Take 1 tablet (15 mg total) by mouth daily., Disp: 30 tablet, Rfl: 2 .  metFORMIN (GLUCOPHAGE) 1000 MG tablet, TAKE ONE TABLET BY MOUTH 2 TIMES A DAY, Disp: 60 tablet, Rfl: 1 .  pantoprazole  (PROTONIX) 40 MG tablet, Take 1 tablet (40 mg total) by mouth daily., Disp: 90 tablet, Rfl: 1 .  rosuvastatin (CRESTOR) 5 MG tablet, Take 1 tablet (5 mg total) by mouth daily., Disp: 90 tablet, Rfl: 3 .  SUMAtriptan (IMITREX) 100 MG tablet, TAKE ONE TABLET BY MOUTH AS NEEDED FOR MIGRAINE, Disp: 9 tablet, Rfl: 3 .  VIBERZI 100 MG TABS, Take 1 tablet by mouth twice daily with food, Disp: 180 tablet, Rfl: 0   Allergies  Allergen Reactions  . Celebrex [Celecoxib] Hives  . Morphine And Related     migraine    CONSTITUTIONAL: Negative for chills, fatigue, fever, unintentional weight gain and unintentional weight loss.   CARDIOVASCULAR: Negative for chest pain,  palpitations and pedal edema.  RESPIRATORY: Negative for recent cough and dyspnea.   NEUROLOGICAL: see HPI ---  No vision changes no headaches      Objective:    PHYSICAL EXAM:   VS: BP 116/76 (BP Location: Left Arm, Patient Position: Sitting)   Pulse 94   Temp (!) 97.5 F (36.4 C) (Temporal)   Ht 5' 0.5" (1.537 m)   Wt 164 lb 12.8 oz (74.8 kg)   SpO2 97%   BMI 31.66 kg/m   GEN: Well nourished, well developed, in no acute distress  Cardiac: RRR; no murmurs, rubs, or gallops,no edema - Respiratory:  normal respiratory rate and pattern with no distress - normal breath sounds with no rales, rhonchi, wheezes or rubs  Skin: warm and dry, no rash  musc - low back pain to palpation - normal rom Neuro:  Alert and Oriented x 3, Strength and sensation are intact - CN II-Xii grossly intact - Gait when first stands and walks she shuffles then straightens out as she does down hall - NEURO EXAM OTHERWISE NORMAL Psych: euthymic mood, appropriate affect and demeanor   Wt Readings from Last 3 Encounters:  02/15/20 164 lb 12.8 oz (74.8 kg)  02/03/20 163 lb (73.9 kg)  01/25/20 163 lb (73.9 kg)    Health Maintenance Due  Topic Date Due  . Hepatitis C Screening  Never done  . PNEUMOCOCCAL POLYSACCHARIDE VACCINE AGE 3-64 HIGH RISK   Never done  . OPHTHALMOLOGY EXAM  Never done  . URINE MICROALBUMIN  Never done  . COVID-19 Vaccine (1) Never done  . HIV Screening  Never done  . TETANUS/TDAP  Never done  . PAP SMEAR-Modifier  Never done  . MAMMOGRAM  Never done  . COLONOSCOPY  Never done    There are no preventive care reminders to display for this patient.        Assessment & Plan:   Problem List Items Addressed This Visit      Other   Dizziness - Primary    Recommend no driving Follow up with neurologist on 03/29/20 as scheduled      Acute bilateral low back pain without sciatica    Range of motion exercises Ice therapy          No orders of the defined types were placed in this encounter.    SARA R Tanis Burnley, PA-C

## 2020-02-22 ENCOUNTER — Other Ambulatory Visit: Payer: Self-pay | Admitting: Physician Assistant

## 2020-03-01 ENCOUNTER — Other Ambulatory Visit: Payer: Self-pay | Admitting: Physician Assistant

## 2020-03-23 DIAGNOSIS — I10 Essential (primary) hypertension: Secondary | ICD-10-CM | POA: Diagnosis not present

## 2020-03-23 DIAGNOSIS — E785 Hyperlipidemia, unspecified: Secondary | ICD-10-CM | POA: Diagnosis not present

## 2020-03-23 DIAGNOSIS — E119 Type 2 diabetes mellitus without complications: Secondary | ICD-10-CM | POA: Diagnosis not present

## 2020-03-23 DIAGNOSIS — K580 Irritable bowel syndrome with diarrhea: Secondary | ICD-10-CM | POA: Diagnosis not present

## 2020-03-27 DIAGNOSIS — K7460 Unspecified cirrhosis of liver: Secondary | ICD-10-CM | POA: Diagnosis not present

## 2020-03-27 DIAGNOSIS — D7389 Other diseases of spleen: Secondary | ICD-10-CM | POA: Diagnosis not present

## 2020-03-27 DIAGNOSIS — K7689 Other specified diseases of liver: Secondary | ICD-10-CM | POA: Diagnosis not present

## 2020-03-29 ENCOUNTER — Ambulatory Visit: Payer: BC Managed Care – PPO | Admitting: Neurology

## 2020-03-29 ENCOUNTER — Encounter: Payer: Self-pay | Admitting: Neurology

## 2020-03-29 VITALS — BP 116/72 | HR 89 | Ht 60.0 in | Wt 169.0 lb

## 2020-03-29 DIAGNOSIS — R413 Other amnesia: Secondary | ICD-10-CM | POA: Diagnosis not present

## 2020-03-29 DIAGNOSIS — R260 Ataxic gait: Secondary | ICD-10-CM

## 2020-03-29 MED ORDER — ROSUVASTATIN CALCIUM 5 MG PO TABS
10.0000 mg | ORAL_TABLET | Freq: Every day | ORAL | 3 refills | Status: AC
Start: 2020-03-29 — End: ?

## 2020-03-29 NOTE — Progress Notes (Signed)
[facility] [address] [place]. [place] [address] [phone]       OFFICE CONSULT NOTE  [name] Date of Birth:  September 06, 1965 Medical Record Number:  [id number]   Referring MD: Fidela Juneau, PA-C  Reason for Referral: Dizziness  HPI: [name] is a 54 year old Caucasian lady seen today for initial office consultation visit for dizziness.  She is accompanied by her daughter.  History is obtained from them and review of referral notes and I personally reviewed available imaging films in PACS.  She has past medical history of diabetes, hypertension, hyperlipidemia, kidney stones and arthritis.  She states that on 01/27/2020 she fell asleep in a chair.  Her daughter woke her up in she was off balance and needed help to go to the bedroom.  She was not speaking a lot. Morning she slept the whole day and acted confused and did not remember the names of her grandchildren and their birthdays.  She was dizzy off balance and staggering and tripping easily.  This has persisted.  She started using a cane but still her balance is not back to normal and she often leans to the left.  She also had trouble driving and was driving erratically and her family has advised her not to drive.  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  She subsequently had lab work done on 02/03/2020 which showed LDL cholesterol of 94 which was actually improved from 3 months ago when it was 168 and she has been started on Crestor 5 mg following that.  Last hemoglobin A1c was 6.0 on 11/08/2019.  She continues to smoke 3/4 pack cigarettes per day but has previously quit in the past but stated she restarted 2 years ago after her mother died.  She is also complaining of short-term memory difficulties and confusion off-and-on which has persisted for the last 2  months.  She denies any slurred speech, facial weakness, extremity weakness or numbness, headache blurred vision or double vision.  She has no known prior history of strokes or TIAs or significant neurological problems.  There is no family history of strokes or TIAs. ROS:   14 system review of systems is positive for sleepiness, memory loss, word finding difficulty, gait ataxia, staggering, falling and all other systems negative  PMH:  Past Medical History:  Diagnosis Date  . Anxiety   . Arthritis   . Diabetes mellitus without complication (HCC) 2013   type 2  . Fibromyalgia   . History of kidney stones   . Hypercholesteremia   . Hypertension   . IBS (irritable bowel syndrome)   . Migraine   . Spastic colon     Social History:  Social History   Socioeconomic History  . Marital status: Married    Spouse name: Not on file  . Number of children: 2  . Years of education: Not on file  . Highest education level: Not on file  Occupational History  . Not on file  Tobacco Use  . Smoking status: Current Every Day Smoker    Packs/day: 1.00    Years: 24.00    Pack years: 24.00    Types: Cigarettes    Last attempt to quit: 04/26/2010    Years since quitting: 9.9  . Smokeless tobacco: Never Used  Vaping Use  . Vaping Use: Never used  Substance and Sexual Activity  . Alcohol use: No    Alcohol/week: 0.0 standard drinks  . Drug use: No  . Sexual activity: Not on file  Other Topics Concern  . Not on file  Social History Narrative  . Not on file   Social Determinants of Health   Financial Resource Strain:   . Difficulty of Paying Living Expenses:   Food Insecurity:   . Worried About Programme researcher, broadcasting/film/video in the Last Year:   . Barista in the Last Year:   Transportation Needs:   . Freight forwarder (Medical):   Marland Kitchen Lack of Transportation (Non-Medical):   Physical Activity:   . Days of Exercise per Week:   . Minutes of Exercise per Session:   Stress:   . Feeling  of Stress :   Social Connections:   . Frequency of Communication with Friends and Family:   . Frequency of Social Gatherings with Friends and Family:   . Attends Religious Services:   . Active Member of Clubs or Organizations:   . Attends Banker Meetings:   Marland Kitchen Marital Status:   Intimate Partner Violence:   . Fear of Current or Ex-Partner:   . Emotionally Abused:   Marland Kitchen Physically Abused:   . Sexually Abused:     Medications:   Current Outpatient Medications on File Prior to Visit  Medication Sig Dispense Refill  . atenolol (TENORMIN) 50 MG tablet TAKE 1 TABLET BY MOUTH ONCE DAILY 30 tablet 2  . buPROPion (WELLBUTRIN XL) 300 MG 24 hr tablet TAKE 1 TABLET BY MOUTH ONCE DAILY 30 tablet 2  . busPIRone (BUSPAR) 15 MG tablet TAKE ONE TABLET BY MOUTH 2 TIMES A DAY 60 tablet 2  . clonazePAM (KLONOPIN) 1 MG tablet TAKE ONE TABLET BY MOUTH 2 TIMES A DAY AS NEEDED FOR ANXIETY 60 tablet 0  . DULoxetine (CYMBALTA) 60 MG capsule TAKE ONE CAPSULE BY MOUTH 2 TIMES A DAY 60 capsule 2  . meloxicam (MOBIC) 15 MG tablet Take 1 tablet (15 mg total) by mouth daily. 30 tablet 2  . metFORMIN (GLUCOPHAGE) 1000 MG tablet TAKE ONE TABLET BY MOUTH 2 TIMES A DAY 60 tablet 1  . pantoprazole (PROTONIX) 40 MG tablet Take 1 tablet (40 mg total) by mouth daily. 90 tablet 1  . SUMAtriptan (IMITREX) 100 MG tablet TAKE ONE TABLET BY MOUTH AS NEEDED FOR MIGRAINE 9 tablet 3  . VIBERZI 100 MG TABS Take 1 tablet by mouth twice daily with food 180 tablet 0   No current facility-administered medications on file prior to visit.    Allergies:   Allergies  Allergen Reactions  . Celebrex [Celecoxib] Hives  . Morphine And Related     migraine    Physical Exam General: Mildly obese middle-aged Caucasian lady seated, in no evident distress Head: head normocephalic and atraumatic.   Neck: supple with no carotid or supraclavicular bruits Cardiovascular: regular rate and rhythm, no murmurs Musculoskeletal: no  deformity Skin:  no rash/petichiae Vascular:  Normal pulses all extremities  Neurologic Exam Mental Status: Awake and fully alert. Oriented to place and time. Recent and remote memory intact. Attention span, concentration and fund of knowledge appropriate. Mood and affect appropriate.  Diminished recall 1/3.  Mini-Mental status exam score 27/30.  PHQ-9 depression scale scored 8.  Clock drawing 4/4.  Able to copy intersecting pentagons well. Cranial Nerves: Fundoscopic exam reveals sharp disc margins. Pupils equal, briskly reactive to light. Extraocular movements full without nystagmus.  Visual fields full to confrontation. Hearing intact. Facial sensation intact. Face, tongue, palate moves normally and symmetrically.  Motor: Normal bulk and tone. Normal strength in all tested extremity muscles. Sensory.: intact to touch , pinprick , position and vibratory sensation.  Coordination: Rapid alternating movements normal in all extremities. Finger-to-nose and heel-to-shin performed accurately bilaterally though slightly slower on the left compared to the right. Gait and Station: Arises from chair without difficulty. Stance is broad-based. Gait demonstrates mild ataxia with leaning to the left.  Uses a cane.  Unsteady while standing on a narrow base with eyes open and closed.  Unable to walk tandem  .  reflexes: 2+ and symmetric. Toes downgoing.   NIHSS  0 Modified Rankin  2   ASSESSMENT: 54 year old Caucasian lady with sudden onset of gait ataxia, confusion and memory loss 2 months ago likely due to brainstem/subcortical infarct not visualized on initial CT scan.  Multiple vascular risk factors of obesity, hypertension,, diabetes, hyperlipidemia, smoking and silent cerebrovascular disease.  She also has mild memory and cognitive difficulties likely from mild cognitive impairment.    PLAN: I had a long d/w patient and her daughter about her episode of sudden onset of gait ataxia, confusion and memory  difficulties likely representing a subcortical brainstem stroke not visualized on CT scan, risk for recurrent stroke/TIAs, personally independently reviewed imaging studies and stroke evaluation results and answered questions.Continue aspirin 325 mg daily  for secondary stroke prevention and maintain strict control of hypertension with blood pressure goal below 130/90, diabetes with hemoglobin A1c goal below 6.5% and lipids with LDL cholesterol goal below 70 mg/dL. I also advised the patient to eat a healthy diet with plenty of whole grains, cereals, fruits and vegetables, exercise regularly and maintain ideal body weight.  I counseled the patient to quit smoking and seek help from primary care physician for the same.  Check CT angiogram of the brain and neck, MRI scan of the brain, echocardiogram studies.  Referred to physical therapy for gait and balance.  I recommend she increase the dose of Crestor to 10 mg daily as last lipid profile showed LDL to be suboptimal.  She was advised not to drive and to use a cane at all times and fall and safety precautions.  She also has mild cognitive impairment and recommend checking memory panel labs and increase participation in cognitively challenging activities.  Greater than 50% time during this 45-minute consultation visit was spent on counseling and coordination of care about her gait ataxia and memory loss and discussion about differential diagnosis and evaluation and treatment and answering questions.  Followup in the future with me in 2 months or call earlier if necessary. Delia Heady, MD  [facility] [address] [place], [place] [address]  Phone [phone] Fax [phone] Note: This document was prepared with digital dictation and possible smart phrase technology. Any transcriptional errors that result from this process are unintentional.

## 2020-03-29 NOTE — Patient Instructions (Signed)
I had a long d/w patient and her daughter about her episode of sudden onset of gait ataxia, confusion and memory difficulties likely representing a subcortical brainstem stroke not visualized on CT scan, risk for recurrent stroke/TIAs, personally independently reviewed imaging studies and stroke evaluation results and answered questions.Continue aspirin 325 mg daily  for secondary stroke prevention and maintain strict control of hypertension with blood pressure goal below 130/90, diabetes with hemoglobin A1c goal below 6.5% and lipids with LDL cholesterol goal below 70 mg/dL. I also advised the patient to eat a healthy diet with plenty of whole grains, cereals, fruits and vegetables, exercise regularly and maintain ideal body weight.  I counseled the patient to quit smoking and seek help from primary care physician for the same.  Check CT angiogram of the brain and neck, MRI scan of the brain, echocardiogram studies.  Referred to physical therapy for gait and balance.  I recommend she increase the dose of Crestor to 10 mg daily as last lipid profile showed LDL to be suboptimal.  She was advised not to drive and to use a cane at all times and fall and safety precautions.  She also has mild cognitive impairment and recommend checking memory panel labs and increase participation in cognitively challenging activities.  Followup in the future with me in 2 months or call earlier if necessary.  Stroke Prevention Some medical conditions and behaviors are associated with a higher chance of having a stroke. You can help prevent a stroke by making nutrition, lifestyle, and other changes, including managing any medical conditions you may have. What nutrition changes can be made?   Eat healthy foods. You can do this by: ? Choosing foods high in fiber, such as fresh fruits and vegetables and whole grains. ? Eating at least 5 or more servings of fruits and vegetables a day. Try to fill half of your plate at each meal with  fruits and vegetables. ? Choosing lean protein foods, such as lean cuts of meat, poultry without skin, fish, tofu, beans, and nuts. ? Eating low-fat dairy products. ? Avoiding foods that are high in salt (sodium). This can help lower blood pressure. ? Avoiding foods that have saturated fat, trans fat, and cholesterol. This can help prevent high cholesterol. ? Avoiding processed and premade foods.  Follow your health care provider's specific guidelines for losing weight, controlling high blood pressure (hypertension), lowering high cholesterol, and managing diabetes. These may include: ? Reducing your daily calorie intake. ? Limiting your daily sodium intake to 1,500 milligrams (mg). ? Using only healthy fats for cooking, such as olive oil, canola oil, or sunflower oil. ? Counting your daily carbohydrate intake. What lifestyle changes can be made?  Maintain a healthy weight. Talk to your health care provider about your ideal weight.  Get at least 30 minutes of moderate physical activity at least 5 days a week. Moderate activity includes brisk walking, biking, and swimming.  Do not use any products that contain nicotine or tobacco, such as cigarettes and e-cigarettes. If you need help quitting, ask your health care provider. It may also be helpful to avoid exposure to secondhand smoke.  Limit alcohol intake to no more than 1 drink a day for nonpregnant women and 2 drinks a day for men. One drink equals 12 oz of beer, 5 oz of wine, or 1 oz of hard liquor.  Stop any illegal drug use.  Avoid taking birth control pills. Talk to your health care provider about the risks of taking  birth control pills if: ? You are over 66 years old. ? You smoke. ? You get migraines. ? You have ever had a blood clot. What other changes can be made?  Manage your cholesterol levels. ? Eating a healthy diet is important for preventing high cholesterol. If cholesterol cannot be managed through diet alone, you may  also need to take medicines. ? Take any prescribed medicines to control your cholesterol as told by your health care provider.  Manage your diabetes. ? Eating a healthy diet and exercising regularly are important parts of managing your blood sugar. If your blood sugar cannot be managed through diet and exercise, you may need to take medicines. ? Take any prescribed medicines to control your diabetes as told by your health care provider.  Control your hypertension. ? To reduce your risk of stroke, try to keep your blood pressure below 130/80. ? Eating a healthy diet and exercising regularly are an important part of controlling your blood pressure. If your blood pressure cannot be managed through diet and exercise, you may need to take medicines. ? Take any prescribed medicines to control hypertension as told by your health care provider. ? Ask your health care provider if you should monitor your blood pressure at home. ? Have your blood pressure checked every year, even if your blood pressure is normal. Blood pressure increases with age and some medical conditions.  Get evaluated for sleep disorders (sleep apnea). Talk to your health care provider about getting a sleep evaluation if you snore a lot or have excessive sleepiness.  Take over-the-counter and prescription medicines only as told by your health care provider. Aspirin or blood thinners (antiplatelets or anticoagulants) may be recommended to reduce your risk of forming blood clots that can lead to stroke.  Make sure that any other medical conditions you have, such as atrial fibrillation or atherosclerosis, are managed. What are the warning signs of a stroke? The warning signs of a stroke can be easily remembered as BEFAST.  B is for balance. Signs include: ? Dizziness. ? Loss of balance or coordination. ? Sudden trouble walking.  E is for eyes. Signs include: ? A sudden change in vision. ? Trouble seeing.  F is for face. Signs  include: ? Sudden weakness or numbness of the face. ? The face or eyelid drooping to one side.  A is for arms. Signs include: ? Sudden weakness or numbness of the arm, usually on one side of the body.  S is for speech. Signs include: ? Trouble speaking (aphasia). ? Trouble understanding.  T is for time. ? These symptoms may represent a serious problem that is an emergency. Do not wait to see if the symptoms will go away. Get medical help right away. Call your local emergency services (911 in the [place] Do not drive yourself to the hospital.  Other signs of stroke may include: ? A sudden, severe headache with no known cause. ? Nausea or vomiting. ? Seizure. Where to find more information For more information, visit:  American Stroke Association: www.strokeassociation.org  National Stroke Association: www.stroke.org Summary  You can prevent a stroke by eating healthy, exercising, not smoking, limiting alcohol intake, and managing any medical conditions you may have.  Do not use any products that contain nicotine or tobacco, such as cigarettes and e-cigarettes. If you need help quitting, ask your health care provider. It may also be helpful to avoid exposure to secondhand smoke.  Remember BEFAST for warning signs of stroke. Get help  right away if you or a loved one has any of these signs. This information is not intended to replace advice given to you by your health care provider. Make sure you discuss any questions you have with your health care provider. Document Revised: 08/01/2017 Document Reviewed: 09/24/2016 Elsevier Patient Education  2020 [employer].

## 2020-03-30 ENCOUNTER — Telehealth: Payer: Self-pay

## 2020-03-30 LAB — DEMENTIA PANEL
Homocysteine: 7.4 umol/L (ref 0.0–14.5)
RPR Ser Ql: NONREACTIVE
TSH: 2.09 u[IU]/mL (ref 0.450–4.500)
Vitamin B-12: 1079 pg/mL (ref 232–1245)

## 2020-03-30 NOTE — Telephone Encounter (Signed)
-----   Message from Micki Riley, MD sent at 03/30/2020  9:10 AM EDT ----- Joneen Roach inform the patient that lab work for reversible causes of memory loss back so far is normal

## 2020-03-30 NOTE — Progress Notes (Signed)
Kindly inform the patient that lab work for reversible causes of memory loss back so far is normal

## 2020-03-30 NOTE — Telephone Encounter (Signed)
Pt verified by name and DOB,  normal results given per provider, pt voiced understanding all question answered. °

## 2020-04-03 ENCOUNTER — Ambulatory Visit: Payer: BC Managed Care – PPO | Admitting: Physical Therapy

## 2020-04-06 ENCOUNTER — Telehealth: Payer: Self-pay | Admitting: Neurology

## 2020-04-06 NOTE — Telephone Encounter (Signed)
[employer] [place] Auth: NPR Ref # Valerie Horn on 04/06/20 order sent to GI. They will reach out to the patient to schedule.

## 2020-04-20 ENCOUNTER — Ambulatory Visit
Admission: RE | Admit: 2020-04-20 | Discharge: 2020-04-20 | Disposition: A | Discharge status: Home / Self Care | Payer: BC Managed Care – PPO | Source: Ambulatory Visit | Attending: Neurology | Admitting: Neurology

## 2020-04-20 ENCOUNTER — Ambulatory Visit: Payer: BC Managed Care – PPO

## 2020-04-20 DIAGNOSIS — R260 Ataxic gait: Secondary | ICD-10-CM

## 2020-04-21 NOTE — Progress Notes (Signed)
Kindly inform the patient that lab work for reversible causes of memory loss was all normal

## 2020-04-25 ENCOUNTER — Other Ambulatory Visit: Payer: Self-pay

## 2020-04-25 ENCOUNTER — Ambulatory Visit
Admission: RE | Admit: 2020-04-25 | Discharge: 2020-04-25 | Disposition: A | Discharge status: Home / Self Care | Payer: BC Managed Care – PPO | Source: Ambulatory Visit | Attending: Neurology | Admitting: Neurology

## 2020-04-25 DIAGNOSIS — R413 Other amnesia: Secondary | ICD-10-CM | POA: Diagnosis not present

## 2020-04-25 MED ORDER — GADOBENATE DIMEGLUMINE 529 MG/ML IV SOLN
15.0000 mL | Freq: Once | INTRAVENOUS | Status: AC | PRN
  Administered 2020-04-25: 15 mL via INTRAVENOUS

## 2020-04-27 ENCOUNTER — Telehealth: Payer: Self-pay | Admitting: Neurology

## 2020-04-27 NOTE — Telephone Encounter (Signed)
LVM for patient regarding payment for short-term disability form. gb

## 2020-05-04 ENCOUNTER — Other Ambulatory Visit: Payer: BC Managed Care – PPO

## 2020-05-08 NOTE — Progress Notes (Signed)
Kindly inform patient that brain MRI shows evidence of old strokes on both sides of brain and changes of hardening of arteries but no new or worrisome findings.

## 2020-05-09 ENCOUNTER — Telehealth: Payer: Self-pay | Admitting: *Deleted

## 2020-05-09 NOTE — Telephone Encounter (Signed)
Spoke with patient and informed her that brain MRI shows evidence of old strokes on both sides of brain and changes of hardening of arteries but no new or worrisome findings. She verbalized understanding, appreciation.

## 2020-05-15 ENCOUNTER — Other Ambulatory Visit: Payer: Self-pay | Admitting: Physician Assistant

## 2020-05-15 DIAGNOSIS — Z6833 Body mass index (BMI) 33.0-33.9, adult: Secondary | ICD-10-CM | POA: Diagnosis not present

## 2020-05-15 DIAGNOSIS — F419 Anxiety disorder, unspecified: Secondary | ICD-10-CM | POA: Diagnosis not present

## 2020-05-15 DIAGNOSIS — F329 Major depressive disorder, single episode, unspecified: Secondary | ICD-10-CM | POA: Diagnosis not present

## 2020-05-18 ENCOUNTER — Other Ambulatory Visit: Payer: Managed Care, Other (non HMO)

## 2020-05-18 ENCOUNTER — Ambulatory Visit: Payer: BC Managed Care – PPO | Admitting: Physician Assistant

## 2020-05-18 ENCOUNTER — Other Ambulatory Visit: Payer: Self-pay

## 2020-06-08 ENCOUNTER — Ambulatory Visit (INDEPENDENT_AMBULATORY_CARE_PROVIDER_SITE_OTHER): Payer: Managed Care, Other (non HMO) | Admitting: Neurology

## 2020-06-08 ENCOUNTER — Ambulatory Visit
Admission: RE | Admit: 2020-06-08 | Discharge: 2020-06-08 | Disposition: A | Discharge status: Home / Self Care | Payer: Managed Care, Other (non HMO) | Source: Ambulatory Visit | Attending: Neurology | Admitting: Neurology

## 2020-06-08 ENCOUNTER — Other Ambulatory Visit: Payer: Self-pay

## 2020-06-08 ENCOUNTER — Encounter: Payer: Self-pay | Admitting: Neurology

## 2020-06-08 VITALS — BP 126/69 | HR 87 | Ht 60.5 in | Wt 169.8 lb

## 2020-06-08 DIAGNOSIS — R260 Ataxic gait: Secondary | ICD-10-CM | POA: Diagnosis not present

## 2020-06-08 DIAGNOSIS — G3184 Mild cognitive impairment, so stated: Secondary | ICD-10-CM

## 2020-06-08 IMAGING — CT CT ANGIO HEAD
1 series · 12 of 30 positions shown · IV contrast (APPLIED)
Comparison: Brain MRI 04/25/2020. Dafne Fu CT
01/25/2020.

CLINICAL DATA: 54-year-old female with history of right anterior
frontal lobe infarct. Neurologic deficit, memory loss and unsteady
gait. Smoker.

Creatinine was obtained on site at [HOSPITAL] at [HOSPITAL].
Results: Creatinine 1.2 mg/dL.
EXAM:
CT ANGIOGRAPHY HEAD AND NECK
TECHNIQUE: Multidetector CT imaging of the head and neck was performed using
the standard protocol during bolus administration of intravenous
contrast. Multiplanar CT image reconstructions and MIPs were
obtained to evaluate the vascular anatomy. Carotid stenosis
measurements (when applicable) are obtained utilizing NASCET
criteria, using the distal internal carotid diameter as the
denominator.
CONTRAST:  75mL DTCESK-3ZU IOPAMIDOL (DTCESK-3ZU) INJECTION 76%

[Series 13: cor mip thick · coronal · 0.49mm/px · 12 of 54 slices shown]
[im 4/54  brain]
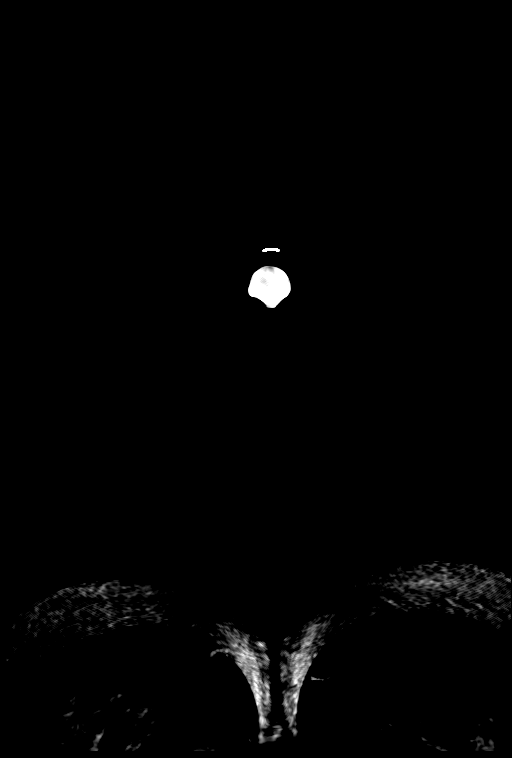
[im 8/54  bone]
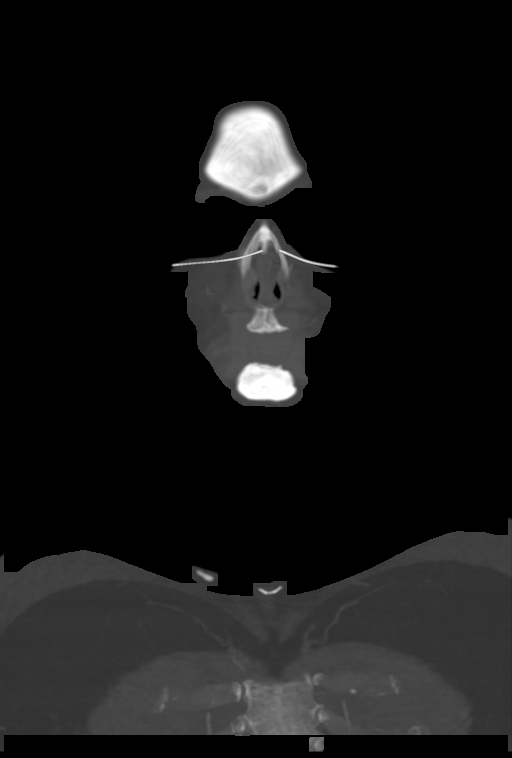
[im 11/54  brain]
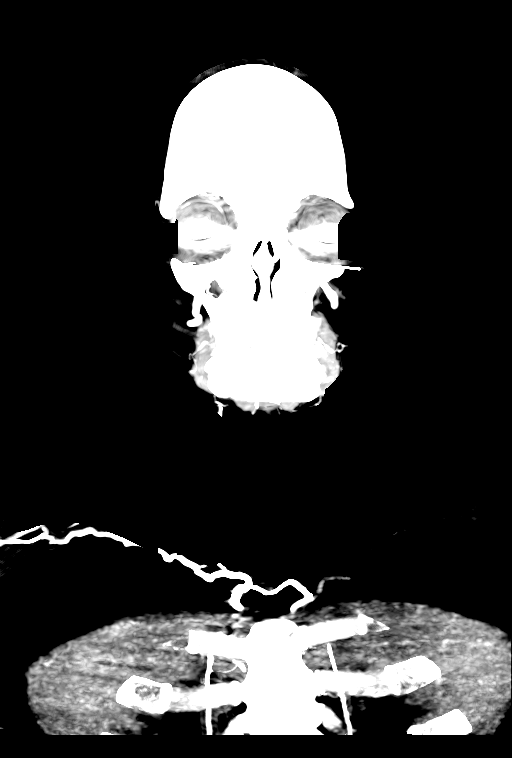
[im 15/54  bone]
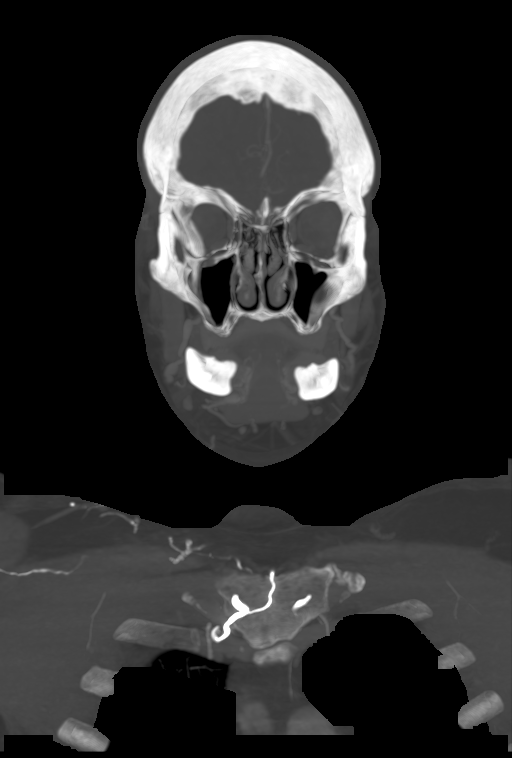
[im 19/54  brain]
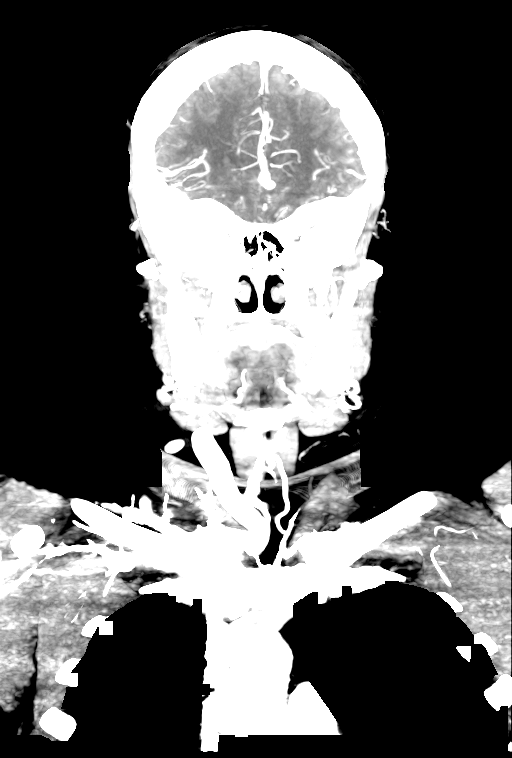
[im 22/54  bone]
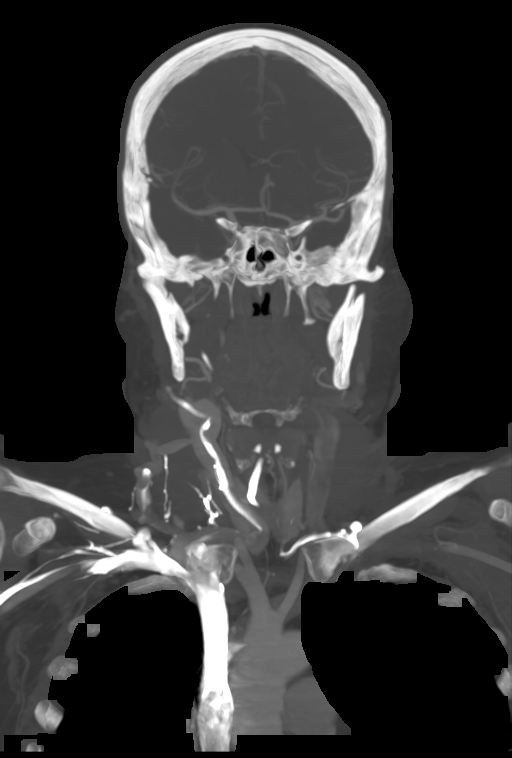
[im 28/54  brain]
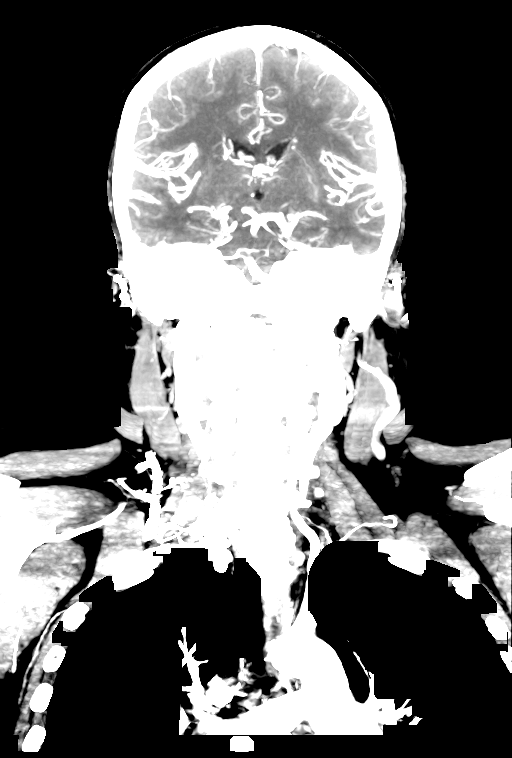
[im 32/54  bone]
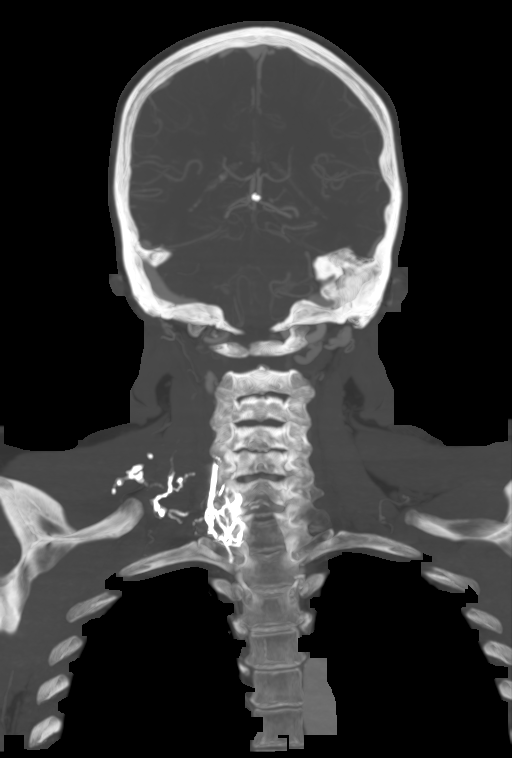
[im 35/54  brain]
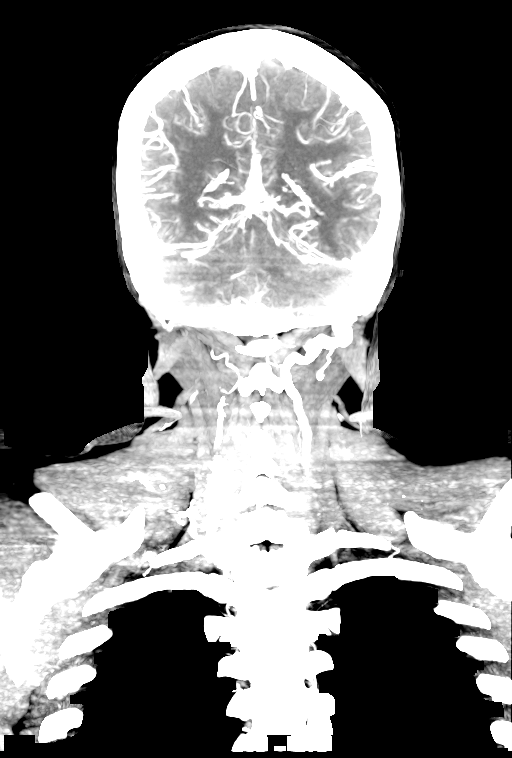
[im 39/54  bone]
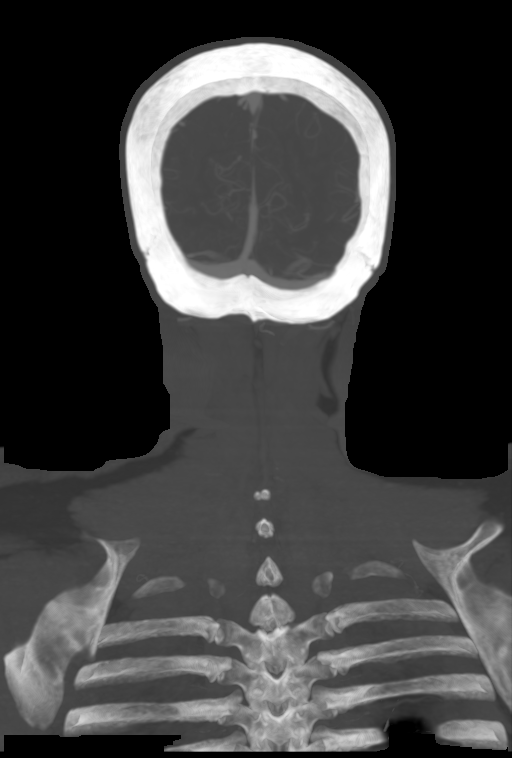
[im 43/54  brain]
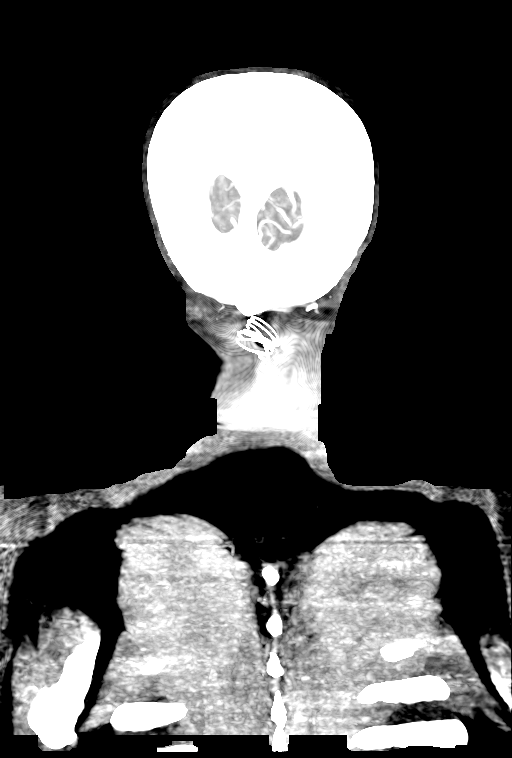
[im 46/54  bone]
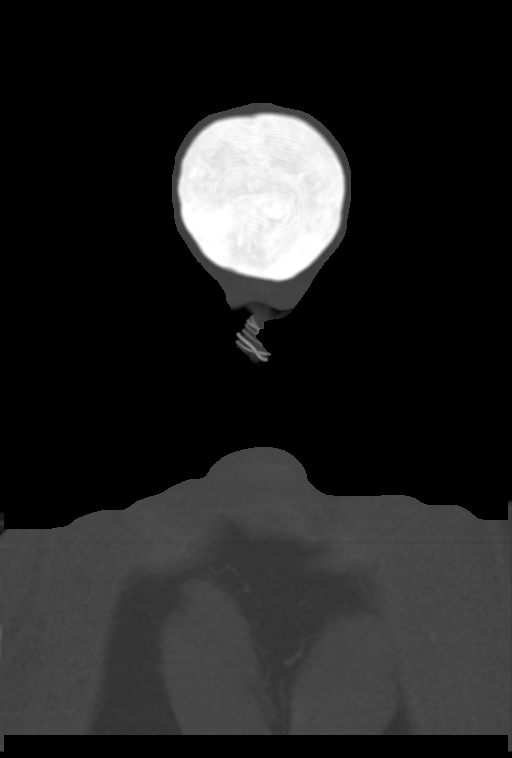

[12 of 30 positions shown; findings below may reference images not displayed]

FINDINGS: CT HEAD

Brain: No midline shift, mass effect, or evidence of intracranial
mass lesion. No acute intracranial hemorrhage identified. No
ventriculomegaly. Right inferior frontal gyrus encephalomalacia
appears stable. Small chronic lacunar infarct suspected in the left
corona radiata. No cortically based acute infarct identified.

Calvarium and skull base: Negative.

Paranasal sinuses: Paranasal sinuses, tympanic cavities and mastoids
are clear.

Orbits: Visualized orbits and scalp soft tissues are within normal
limits.

CTA NECK

Skeleton: Absent dentition. T3 benign vertebral body hemangioma. No
acute osseous abnormality identified.

Upper chest: Small calcified right hilar postinflammatory lymph
node. Partially visible mild curvilinear scarring in the left mid
lung.

Other neck: Negative.

Aortic arch: Bovine type arch configuration. No arch
atherosclerosis.

Right carotid system: Minimal plaque in the cervical right carotid
without stenosis.

Left carotid system: Minimal soft plaque in the left ICA bulb
without stenosis. Tortuous left ICA at C2.

Vertebral arteries:
Normal proximal right subclavian artery. Non dominant right
vertebral artery with normal origin but the V1 segment obscured by
paravertebral contrast reflux. The right vertebral remains non
dominant but is patent to the skull base with no visible stenosis.

Proximal left subclavian artery is patent with minimal plaque.
Normal left vertebral artery origin. Dominant left vertebral is
normal to the skull base.

CTA HEAD

Posterior circulation: Patent right PICA origin. Diminutive right
vertebral artery does remain patent to the vertebrobasilar junction.
Left vertebral primarily supplies the basilar. Normal left PICA
origin. No distal vertebral or basilar stenosis.

Mild fusiform enlargement and/or subtle partial fenestration of the
basilar artery is noted in its distal [DATE] (series 12, image 110 and
series 13, image 27). No saccular aneurysm or stenosis.

Basilar tip, SCA and PCA origins are within normal limits. Both
posterior communicating arteries are present, the left is larger.
Bilateral PCA branches are within normal limits.

Anterior circulation: Both ICA siphons are patent. Mild tortuosity
without plaque on the right. Minimal calcified plaque on the left
without stenosis. Normal posterior communicating artery origins.
Patent carotid termini. Normal MCA and ACA origins. Diminutive
anterior communicating artery. Small median artery of the corpus
callosum. ACA branches are within normal limits. Left MCA M1 segment
and bifurcation are patent without stenosis. Right MCA M1 and
bifurcation also patent without stenosis. Bilateral MCA branches are
within normal limits.

Venous sinuses: Patent.

Anatomic variants: Dominant left vertebral artery primarily supplies
the basilar. Bovine arch configuration.

Review of the MIP images confirms the above findings
IMPRESSION: 1. Minimal atherosclerosis in the head and neck with no arterial
stenosis identified.

2. Subtle fenestration of the mid basilar artery suspected. No
intracranial aneurysm.

3. Stable CT appearance of the brain, with chronic right anterior
MCA infarct and left corona radiata lacune.

4. Partially visible scarring in the left lung.

## 2020-06-08 MED ORDER — PREVAGEN 10 MG PO CAPS: 1.0000 | ORAL_CAPSULE | Freq: Every morning | ORAL | 0 refills | Status: AC

## 2020-06-08 MED ORDER — IOPAMIDOL (ISOVUE-370) INJECTION 76%
75.0000 mL | Freq: Once | INTRAVENOUS | Status: AC | PRN
  Administered 2020-06-08: 75 mL via INTRAVENOUS

## 2020-06-08 NOTE — Progress Notes (Signed)
[facility] [address] [place]. [place] [address] [phone]       OFFICE FOLLOW UP VISIT NOTE  [name] Date of Birth:  29-Jun-1966 Medical Record Number:  [id number]   Referring MD: Fidela Juneau, PA-C  Reason for Referral: Dizziness  HPI initial visit 03/29/2020:: [name] is a 54 year old Caucasian lady seen today for initial office consultation visit for dizziness.  She is accompanied by her daughter.  History is obtained from them and review of referral notes and I personally reviewed available imaging films in PACS.  She has past medical history of diabetes, hypertension, hyperlipidemia, kidney stones and arthritis.  She states that on 01/27/2020 she fell asleep in a chair.  Her daughter woke her up in she was off balance and needed help to go to the bedroom.  She was not speaking a lot. Morning she slept the whole day and acted confused and did not remember the names of her grandchildren and their birthdays.  She was dizzy off balance and staggering and tripping easily.  This has persisted.  She started using a cane but still her balance is not back to normal and she often leans to the left.  She also had trouble driving and was driving erratically and her family has advised her not to drive.  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  She subsequently had lab work done on 02/03/2020 which showed LDL cholesterol of 94 which was actually improved from 3 months ago when it was 168 and she has been started on Crestor 5 mg following that.  Last hemoglobin A1c was 6.0 on 11/08/2019.  She continues to smoke 3/4 pack cigarettes per day but has previously quit in the past but stated she restarted 2 years ago after her mother died.  She is also complaining of short-term memory difficulties and confusion off-and-on  which has persisted for the last 2 months.  She denies any slurred speech, facial weakness, extremity weakness or numbness, headache blurred vision or double vision.  She has no known prior history of strokes or TIAs or significant neurological problems.  There is no family history of strokes or TIAs. Update 06/08/2020: She returns for follow-up after last visit 2 months ago.  She states she is done well.  She has had no recurrent stroke or TIA symptoms.  She continues to have gait and balance difficulties but uses a cane and is careful and has not had any falls or injuries.  He continues to have short-term memory difficulties which are also unchanged.  She has started participating in memory challenging activities like solving crossword puzzles.  She remains on aspirin which is tolerating well without bleeding or bruising.  Blood pressures well controlled today it is 126/69.  She is tolerating Crestor well and did increase the dose to 10 mg as follow-up lipid profile on 03/29/2028 showed increased LDL of 94 mg percent.  MRI scan on 04/27/2020 showed remote age small right frontal as well as left subcortical infarcts.  CT angiogram done today personally reviewed shows no large vessel stenosis or occlusion.  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 ROS:   14 system review of systems is positive for sleepiness, memory loss, word finding difficulty, gait ataxia, staggering, falling and  all other systems negative  PMH:  Past Medical History:  Diagnosis Date  . Anxiety   . Arthritis   . Diabetes mellitus without complication (HCC) 2013   type 2  . Fibromyalgia   . History of kidney stones   . Hypercholesteremia   . Hypertension   . IBS (irritable bowel syndrome)   . Migraine   . Spastic colon     Social History:  Social History   Socioeconomic History  . Marital status: Married    Spouse name: Not on file  . Number  of children: 2  . Years of education: Not on file  . Highest education level: Not on file  Occupational History  . Occupation: unemployed   Tobacco Use  . Smoking status: Current Every Day Smoker    Packs/day: 1.00    Years: 24.00    Pack years: 24.00    Types: Cigarettes  . Smokeless tobacco: Never Used  Vaping Use  . Vaping Use: Never used  Substance and Sexual Activity  . Alcohol use: No    Alcohol/week: 0.0 standard drinks  . Drug use: No  . Sexual activity: Not on file  Other Topics Concern  . Not on file  Social History Narrative   Lives with husband and daughter and her 5 children, stepdad and cousin and her 3 children   Right handed   Drinks 3 cups of caffeine daily   Social Determinants of Health   Financial Resource Strain:   . Difficulty of Paying Living Expenses: Not on file  Food Insecurity:   . Worried About Programme researcher, broadcasting/film/videounning Out of Food in the Last Year: Not on file  . Ran Out of Food in the Last Year: Not on file  Transportation Needs:   . Lack of Transportation (Medical): Not on file  . Lack of Transportation (Non-Medical): Not on file  Physical Activity:   . Days of Exercise per Week: Not on file  . Minutes of Exercise per Session: Not on file  Stress:   . Feeling of Stress : Not on file  Social Connections:   . Frequency of Communication with Friends and Family: Not on file  . Frequency of Social Gatherings with Friends and Family: Not on file  . Attends Religious Services: Not on file  . Active Member of Clubs or Organizations: Not on file  . Attends BankerClub or Organization Meetings: Not on file  . Marital Status: Not on file  Intimate Partner Violence:   . Fear of Current or Ex-Partner: Not on file  . Emotionally Abused: Not on file  . Physically Abused: Not on file  . Sexually Abused: Not on file    Medications:   Current Outpatient Medications on File Prior to Visit  Medication Sig Dispense Refill  . aspirin 325 MG tablet Take 325 mg by mouth daily.     Marland Kitchen. atenolol (TENORMIN) 50 MG tablet TAKE 1 TABLET BY MOUTH ONCE DAILY 30 tablet 2  . buPROPion (WELLBUTRIN XL) 300 MG 24 hr tablet TAKE 1 TABLET BY MOUTH ONCE DAILY 30 tablet 2  . busPIRone (BUSPAR) 15 MG tablet TAKE ONE TABLET BY MOUTH 2 TIMES A DAY (Patient taking differently: 3 (three) times daily. ) 60 tablet 2  . clonazePAM (KLONOPIN) 1 MG tablet TAKE ONE TABLET BY MOUTH 2 TIMES A DAY AS NEEDED FOR ANXIETY 60 tablet 0  . DULoxetine (CYMBALTA) 60 MG capsule TAKE ONE CAPSULE BY MOUTH 2 TIMES A DAY 60 capsule 2  . meloxicam (MOBIC)  15 MG tablet Take 1 tablet (15 mg total) by mouth daily. 30 tablet 2  . metFORMIN (GLUCOPHAGE) 1000 MG tablet TAKE ONE TABLET BY MOUTH 2 TIMES A DAY 60 tablet 1  . pantoprazole (PROTONIX) 40 MG tablet Take 1 tablet (40 mg total) by mouth daily. 90 tablet 1  . rosuvastatin (CRESTOR) 5 MG tablet Take 2 tablets (10 mg total) by mouth daily. (Patient taking differently: Take 10 mg by mouth in the morning, at noon, and at bedtime. ) 90 tablet 3  . SUMAtriptan (IMITREX) 100 MG tablet TAKE ONE TABLET BY MOUTH AS NEEDED FOR MIGRAINE 9 tablet 3  . VIBERZI 100 MG TABS Take 1 tablet by mouth twice daily with food 180 tablet 0   No current facility-administered medications on file prior to visit.    Allergies:   Allergies  Allergen Reactions  . Celebrex [Celecoxib] Hives  . Morphine And Related     migraine    Physical Exam General: Mildly obese middle-aged Caucasian lady seated, in no evident distress Head: head normocephalic and atraumatic.   Neck: supple with no carotid or supraclavicular bruits Cardiovascular: regular rate and rhythm, no murmurs Musculoskeletal: no deformity Skin:  no rash/petichiae Vascular:  Normal pulses all extremities  Neurologic Exam Mental Status: Awake and fully alert. Oriented to place and time. Recent and remote memory intact. Attention span, concentration and fund of knowledge appropriate. Mood and affect appropriate.  Diminished  recall 1/3.  Mini-Mental status exam not done( last visit 27/30).   Able to name 10 animals which can walk on 4 legs..  Clock drawing 4/4.  Able to copy intersecting pentagons well. Cranial Nerves: Fundoscopic exam reveals sharp disc margins. Pupils equal, briskly reactive to light. Extraocular movements full without nystagmus. Visual fields full to confrontation. Hearing intact. Facial sensation intact. Face, tongue, palate moves normally and symmetrically.  Motor: Normal bulk and tone. Normal strength in all tested extremity muscles. Sensory.: intact to touch , pinprick , position and vibratory sensation.  Coordination: Rapid alternating movements normal in all extremities. Finger-to-nose and heel-to-shin performed accurately bilaterally though slightly slower on the left compared to the right. Gait and Station: Arises from chair without difficulty. Stance is broad-based. Gait demonstrates mild ataxia with leaning to the left.  Uses a cane.  Unsteady while standing on a narrow base with eyes open and closed.  Unable to walk tandem  . Reflexes: 2+ and symmetric. Toes downgoing.       ASSESSMENT: 54 year old Caucasian lady with sudden onset of gait ataxia, confusion and memory loss 2 months ago likely due to brainstem/subcortical infarct not visualized on initial CT scan.  Multiple vascular risk factors of obesity, hypertension,, diabetes, hyperlipidemia, smoking and silent cerebrovascular disease.  She also has mild memory and cognitive difficulties likely from mild cognitive impairment.    PLAN: I had a long discussion the patient with regards to her gait ataxia and memory loss and mild cognitive impairment which appears to be stable.  I went over the results of CT angiograms and MRI scan and lab work with her and answered questions.  Recommend she continue Aspirin 325 mg daily for stroke prevention and maintain aggressive risk factor modification strict control of diabetes with hemoglobin A1c goal  below 6.5% and lipids with LDL cholesterol goal below 70 mg percent and hypertension with blood pressure goal below 130/90.  I recommend she take pravastatin 10 mg daily for mild cognitive impairment as well as continue participation in cognitively challenging activities like solving crossword  puzzles, playing bridge and sodoku.  He also discussed memory compensation strategies.  She will return for follow-up in the future in 3 months with my nurse practitioner Shanda Bumps or call earlier if necessary. Greater than 50% time during this 25-minute visit was spent on counseling and coordination of care about her gait ataxia and memory loss and answering questions.  Delia Heady, MD  [facility] [address] [place], [place] [address]  Phone [phone] Fax [phone] Note: This document was prepared with digital dictation and possible smart phrase technology. Any transcriptional errors that result from this process are unintentional.

## 2020-06-08 NOTE — Patient Instructions (Signed)
I had a long discussion the patient with regards to her gait ataxia and memory loss and mild cognitive impairment which appears to be stable.  I went over the results of CT angiograms and MRI scan and lab work with her and answered questions.  Recommend she continue Aspirin 325 mg daily for stroke prevention and maintain aggressive risk factor modification strict control of diabetes with hemoglobin A1c goal below 6.5% and lipids with LDL cholesterol goal below 70 mg percent and hypertension with blood pressure goal below 130/90.  I recommend she take pravastatin 10 mg daily for mild cognitive impairment as well as continue participation in cognitively challenging activities like solving crossword puzzles, playing bridge and sodoku.  He also discussed memory compensation strategies.  She will return for follow-up in the future in 3 months with my nurse practitioner Shanda Bumps or call earlier if necessary. Memory Compensation Strategies  1. Use "WARM" strategy.  W= write it down  A= associate it  R= repeat it  M= make a mental note  2.   You can keep a Glass blower/designer.  Use a 3-ring notebook with sections for the following: calendar, important names and phone numbers,  medications, doctors' names/phone numbers, lists/reminders, and a section to journal what you did  each day.   3.    Use a calendar to write appointments down.  4.    Write yourself a schedule for the day.  This can be placed on the calendar or in a separate section of the Memory Notebook.  Keeping a  regular schedule can help memory.  5.    Use medication organizer with sections for each day or morning/evening pills.  You may need help loading it  6.    Keep a basket, or pegboard by the door.  Place items that you need to take out with you in the basket or on the pegboard.  You may also want to  include a message board for reminders.  7.    Use sticky notes.  Place sticky notes with reminders in a place where the task is performed.   For example: " turn off the  stove" placed by the stove, "lock the door" placed on the door at eye level, " take your medications" on  the bathroom mirror or by the place where you normally take your medications.  8.    Use alarms/timers.  Use while cooking to remind yourself to check on food or as a reminder to take your medicine, or as a  reminder to make a call, or as a reminder to perform another task, etc.

## 2020-06-11 NOTE — Progress Notes (Signed)
Kindly inform the patient that CT angiogram of the brain and neck shows no major blockages to worry about.  Couple of old strokes are noted which are not new and nothing to worry about

## 2020-06-12 ENCOUNTER — Telehealth: Payer: Self-pay | Admitting: Emergency Medicine

## 2020-06-12 NOTE — Telephone Encounter (Signed)
Called and reached [place] by phone.  Discussed Dr. Marlis Edelson findingsregarding CT Angiogram.  Patient returned understanding and denied any questions.  Patient expressed appreciation.

## 2020-09-12 DIAGNOSIS — Z0271 Encounter for disability determination: Secondary | ICD-10-CM

## 2020-09-18 ENCOUNTER — Ambulatory Visit: Payer: Managed Care, Other (non HMO) | Admitting: Adult Health

## 2024-06-25 ENCOUNTER — Encounter: Payer: Self-pay | Admitting: Nurse Practitioner

## 2024-08-12 ENCOUNTER — Ambulatory Visit: Admitting: Nurse Practitioner
